# Patient Record
Sex: Female | Born: 1975 | Race: Black or African American | Hispanic: No | Marital: Single
Health system: Midwestern US, Academic
[De-identification: ages and names within clinical notes are randomized; demographics above are authoritative.]

---

## 2010-02-22 ENCOUNTER — Inpatient Hospital Stay

## 2010-02-22 LAB — OFFICE VISIT LAB RESULTS

## 2010-02-22 NOTE — Unmapped (Signed)
Signed by Idelle Leech RN on 02/22/2010 at 08:52:35    Printed Handout:  - Danger Signs and Symptoms of Pregnancy

## 2010-02-22 NOTE — Unmapped (Signed)
Signed by Idelle Leech RN on 02/22/2010 at 09:01:35    Printed Handout:  - IP Policy

## 2010-02-22 NOTE — Unmapped (Signed)
Signed by Idelle Leech RN on 02/22/2010 at 09:01:35    Printed Handout:  - Food Sources of Calcium

## 2010-02-22 NOTE — Unmapped (Signed)
Signed by Aretta Nip NP on 02/22/2010 at 00:00:00  To Receive Records FR: [facility] (REQUEST ONLY)      Imported By: Georgianne Fick 02/22/2010 14:33:55    _____________________________________________________________________    External Attachment:    Please see [facility] EMR for this document.

## 2010-02-22 NOTE — Unmapped (Signed)
Signed by Natasha  Mixon RN on 02/22/2010 at 09:01:35    Printed Handout:  - IP Policy

## 2010-02-22 NOTE — Unmapped (Signed)
Signed by Idelle Leech RN on 02/22/2010 at 09:02:05    Printed Handout:  - Medical Records Release

## 2010-02-22 NOTE — Unmapped (Signed)
Signed by Idelle Leech RN on 02/22/2010 at 12:40:08    PRENATAL VISIT      Reason for Visit   Chief Complaint: New OB Intake Visit  History from: patient    Allergies  ! * PCN    Medications  PRENATAL VITAMINS  TABS (PRENATAL MV & MIN W/FE-FA TABS) by mouth daily  CEPHALEXIN 500 MG CAPS (CEPHALEXIN) by mouth four times a day x 10 days        Vital Signs   Height: 67 in.   Method of Determining Height: per patient  Weight: 151.5 lbs.     BMI (in-lb): 23.81   Pulse rate: 61   Temperature: 99.0 degrees  F   Blood Pressure   BP #1: 99 / 65mm Hg     Pain:     Have you had pain other than everyday aches and pains (e.g., mild headache, back ache, strains) in the past week?   No    Intake recorded by: Idelle Leech RN on February 22, 2010 8:40 AM     Communication Sensory   Primary Language English  Barriers to Care:  Visual.  lazy left eye    Other   Functional Screen/Fall Risk Assessment   In the past two months, patient has experienced:  1.  A decreased ability to walk, turn in bed, get in/out of a chair? No  2.  Decreased ability to care for self, perform routine tasks? No  3.  Recent problem with coordination/movement or loss of balance? No  4.  Use of ambulation device such as walker/cane or crutches? No  5.  Weakness, dizziness, shortness of breath, fatigue with activity? No  6.  Recent frequent history of falling? No  7.  Do you exercise? Yes  What? squats, sit ups, walking  How often? has not been exercising lately     Nutritional Screen   1.  Do you eat a special diet or meal plan? No2.  Have you had a recent weight gain or loss of 10 lbs (in the past 2 months?) No  3.  Do you have a difficulty eating, chewing, swallowing or speaking? No  4.  When was the last time you were seen by the Dentist?  2009     Emotional Psychosocial Spiritual   1.  Do you have any spiritual, religious or cultural rituals that we need to be aware of? No  2.  Patient feels:  Trouble Sleeping  3.  Do you have current, recent thoughts that  you would be better off dead or of hurting yourself? No  4.  Do you have a mental health provider, case manager or payee? No  5.  Patient currently receives:  None.  6.  Do you have adequate resources/medications? Yes  7.  Recent loss:No problem.  8.  Household: Other-with friend     Abuse/Neglect   Due to the increase in domestic violence, we ask all patients:  1.  Have you recently been threatened, frightened, mistreated, hurt or hit by anyone in your life? No  2.  Have you had money or other items taken from you without your permission? No     Wellness   Have you had the following immunizations/vaccinations? (check all that apply)    Tetanus or TDAP (within last 10 yrs?)   Educational Knowledge   Understanding of current health problems: Knowledgeable  Learning Preference: Listening  Questionnaire completed: 02/22/2010    Signed by:  Idelle Leech RN on  February 22, 2010 8:38 AM        Menses History:   [facility] Delivery[facility]  First day LMP: 11/21/2009  This date is: definite  Menses monthly: yes  Frequency:  28 days  Menarche: 16  LMP duration/flow: typical  Contraception used at conception: None  Planned pregnancy: no  Pregnancy confirmed by: urine HCG  Date confirmed: 12/2009  G: 2  P 0:     # Living Children: 0  SAB: 1History of Multifetal Delivery: No    Past History  Past Medical History:  No significant past medical history.  Family History: Mother - cancer-deceased  MGM - cancer-deceased  Social History: Preferred Language: English,   Marital Status: divorced,   Children: None,   Employment Status: unemployed,   Patient Lives at: apartment,   Support System: adequate  Exercise: none,   Caffeine per Day: 0  Seatbelt Use: 100 % of the time  Alcohol Use: none  Drug Use: none  Tobacco Usage:non-smoker      Other Pertinent History:   Abuse: No  Pre-Pregnacy Tobacco Use per Day: 0  Pregnancy Tobacco Use per Day: 0  Pre-Pregnacy Alcohol Use per Day: 0  Pregnancy Alcohol Use per Day:  0  Pre-Pregnancy Illicit/Recreational Drug Use per Day: 0  Pregnancy Illicit/Recreational Drug Use per Day: 0  D(Rh) Sensitized: No  Latex Allergy: No  Betadine Allergy: No  Anesthetic Complications No  History of Abnormal Pap: Yes- colposcopy done in past  Uterine Anomaly: No  History of Infertility: No  DES Exposure: No  ART Treatment: No      Infection History:   Exposed to TB: no   History of Genital Herpes: no   Sexual partner w/hx of oral or genital HSV: no  Rash or Viral Illness since LMP: no  Hepatitis B: no  Vaccinated for Hepatits B: yes  Hepatitis C: no- unsure  History of Varicella Infection: yes  History of STD: yes- chlamydia treated in past  History of PID: no  Do you care for any cats: no    Genetic Testing/Teratology Counseling:   Patient's Age>= 35 Years as of Estimated Date of Delivery: no  Thalassemia Risk: no  Neural Tube Defect: no  Congenital Heart Defect: no   Down Syndrome: no  Tay-Sachs: no  Canavan Disease: no  Familial Dysautonomia: no  Sickle Cell Disease/Trait: no  Hemophilia/Blood Disorders: no  Muscular Dystrophy: no  Cystic Fibrosis: no  Huntington's Chorea: no  Mental Retardation/Autism no  Tested for Fragile X: no  Other Genetic or Chromosomal: no  Maternal Metabolic Disorder: no  Other birth defects: no  Recurrent Pregnancy Loss/Stillbirth: no  Medications/Drugs/Alcohol since LMP: no- Agents and Strength/Dose: antibiotic for UTI  Previous Genetic testing for Cystic Fibrosis: no  Previous Genetic testing father of baby: no    Past Pregnancies:  G:  2 P:  0 A:    SAB: 1  EAB:    Term Deliveries:    Premature Deliveries:   Ectopic pregnancies:    H/O Multi-fetal Delivery:  No    Preg # Date Early Loss/ Termination GA Wks Lgth of Labor Brth Wt Sex Type of Del Anes Pl of Del PTL Fetal Comp Matrnl Comp Cmnts   1 1996 SAB 6             2 current               3  4                      EDD:   Pregnancy Dating Information:    LMP: 11/21/2009    EDD by LMP: 08/28/2010  EDD:  08/28/2010  Today's EGA in weeks: 13 2/7    Prenatal Exam:     Weight      Exam   Est. Gestational Age: 74 2/7          Assessment   34 Years Old Female      G: 2  P 0:     # Living Children: 0  SAB: 1  GA: 13 2/7 weeks       EDD: 08/28/2010    New Problems:   AMENORRHEA (ICD-626.0)  PREGNANCY, MULTIGRAVIDA (ICD-V22.1)      Disposition:   Clinic: NP  Primary OB Provider: Tiandra Swoveland  Return to clinic for Provider Visit Appointment Reason: NOB    *Patient Care Summary printed and given to patient.      Patient Education   Education was provided to: patient  Patient Response: Expressed understanding  1st Trimester Education:  HIV/Prenatal tests, prenatal history risk factors, course of prenatal care, exercise/nutrition/wt gain, toxoplasmosis (cat/raw meat), sexual activity, exercise, influenza vaccine, smoking counseling, environment/work hazards, travel/seatbelts, tobacco/alcohol/drugs, use of medications, indications for ultrasound, domestic violence, childbirth classes, hospital facilities        [age] old female G2  , P0  , hx of one SAB currently at 39 2/[redacted] weeks gestation here to start prenatal care.  Prenatal teaching begun and danger and labor signs in pregnancy reviewed.  L and D triage and [facility] phone numbers given.  Pregnancy statement given and patient reffered to [facility]. Pt stated she [place] prenatal care [place] of [place], will send for prenatal records. Centering discussed and offered and pt excepted, pt given list of dates for [facility], pt to [facility] March 01, 2010 for NOB visit...................................................................Marland KitchenMarcelle Smiling Mixon RN  February 22, 2010 9:15 AM                  ]

## 2010-02-22 NOTE — Unmapped (Signed)
Signed by Idelle Leech RN on 02/22/2010 at 09:01:35    Printed Handout:  - Welcome to [facility]!

## 2010-02-22 NOTE — Unmapped (Signed)
Signed by   LinkLog[employer] on 02/22/2010 at 08:40:46  Patient: Veronica Rocha  Note: All result statuses are Final unless otherwise noted.    Tests: (1) POC HCG QUALITATIVE, URINE (POCPREG)  ! hCG Qualitative      [A]  Positive                    Negative    Note: An exclamation mark (!) indicates a result that was not dispersed into   the flowsheet.  Document Creation Date: 02/22/2010 8:40 AM  _______________________________________________________________________    (1) Order result status: Final  Collection or observation date-time: 02/22/2010 08:29  Requested date-time: 02/22/2010 08:29  Receipt date-time: 02/22/2010 07:58  Reported date-time: 02/22/2010 08:40  Referring Physician: Nelida Gores REFERRAL PT  Ordering Physician: Smitty Pluck PT [phone])  Specimen Source: U&URINE     UACUP&URINALYSIS CONTAINER  Source: Butler Denmark Order Number: [id number]  Lab site: [facility]      [address]      [place] [address]  [phone]

## 2010-02-22 NOTE — Unmapped (Signed)
Signed by Natasha  Mixon RN on 02/22/2010 at 09:01:35    Printed Handout:  - Welcome to [facility]!

## 2010-02-23 NOTE — Unmapped (Signed)
Signed by Aretta Nip NP on 02/27/2010 at 14:51:46  To Receive Records FR: [facility] (RECORDS & REQUEST ENCLOSED)      Imported By: Georgianne Fick 02/23/2010 15:37:38    _____________________________________________________________________    External Attachment:    Please see [facility] EMR for this document.

## 2010-02-28 ENCOUNTER — Inpatient Hospital Stay

## 2010-03-01 LAB — OFFICE VISIT LAB RESULTS
Chlamydia Trachomatis DNA Swab: NEGATIVE
HGB A1: 97 %
Hematocrit: 37 %
Hemoglobin: 12.4 g/dL
Hemoglobinopathy Profile: NORMAL
Hep B Surface Ag: NEGATIVE
Hgb A2 Quant: 2.5 %
Hgb C: 0 %
Hgb F Quant: 0.5 %
Hgb S: 0 %
MCV: 87 fL
N. Gonorrhoeae DNA Probe Result:: NEGATIVE
Rubella Antibodies, IgG: DETECTED

## 2010-03-01 LAB — ABO/RH: Rh Type: POSITIVE

## 2010-03-01 LAB — CYTOLOGY HUMAN PAPILLOMA VIRUS DNA PROBE

## 2010-03-01 LAB — ANTIBODY SCREEN: Antibody Screen: NEGATIVE

## 2010-03-01 NOTE — Unmapped (Signed)
Signed by Aretta Nip NP on 03/06/2010 at 14:40:10  Patient: Veronica Rocha  Note: All result statuses are Final unless otherwise noted.    Tests: (1) URINE CULTURE ([facility])  ! Clinical Report           FINAL Report      Specimen: MIDSTREAM URINE  Collected: 03/01/2010 11:58     Status: Final      Last Updated: 03/02/2010 21:45                Culture Result: (Final)      >100,000 cfu/mL      Lactobacillus species No Further Workup          Note: An exclamation mark (!) indicates a result that was not dispersed into   the flowsheet.  Document Creation Date: 03/02/2010 9:45 PM  _______________________________________________________________________    (1) Order result status: Final  Collection or observation date-time: 03/01/2010 11:58  Requested date-time: 03/01/2010 11:58  Receipt date-time: 03/01/2010 20:14  Reported date-time: 03/02/2010 21:45  Referring Physician: Royetta Asal  Ordering Physician: Alvino Chapel ANN Thornton Dohrmann (VIDETTJA)  Specimen Source: UM&MIDSTREAM URINE     UGRS&Boric Acid, Small Urine Tube  Source: Faith Rogue Order Number: [id number]  Lab site: [facility]      [address]      [place] [address]  [phone]

## 2010-03-01 NOTE — Unmapped (Signed)
Signed by Aretta Nip NP on 03/01/2010 at 12:29:06  Patient: Veronica Rocha  Note: All result statuses are Final unless otherwise noted.    Tests: (1) ABO GROUP & RH TYPE ([facility])    ABO Group                 B    Rh Type                   Positive    Note: An exclamation mark (!) indicates a result that was not dispersed into   the flowsheet.  Document Creation Date: 03/01/2010 11:02 AM  _______________________________________________________________________    (1) Order result status: Final  Collection or observation date-time: 03/01/2010 09:50  Requested date-time: 03/01/2010 09:50  Receipt date-time: 03/01/2010 10:15  Reported date-time: 03/01/2010 11:02  Referring Physician: Royetta Asal  Ordering Physician: Alvino Chapel ANN Birdie Beveridge (VIDETTJA)  Specimen Source: B&BLOOD     PINK6&PINK (6ml Plastic EDTA 13x180mm)  Source: Faith Rogue Order Number: [id number]  Lab site: [facility]      [address]      [place] [address]  [phone]

## 2010-03-01 NOTE — Unmapped (Signed)
Signed by Aretta Nip NP on 03/01/2010 at 12:29:06  Patient: Kara Dies Cressy  Note: All result statuses are Final unless otherwise noted.    Tests: (1) ANTIBODY SCREEN (ABS)    Antibody Screen           Negative    Note: An exclamation mark (!) indicates a result that was not dispersed into   the flowsheet.  Document Creation Date: 03/01/2010 11:17 AM  _______________________________________________________________________    (1) Order result status: Final  Collection or observation date-time: 03/01/2010 09:50  Requested date-time: 03/01/2010 09:50  Receipt date-time: 03/01/2010 10:15  Reported date-time: 03/01/2010 11:18  Referring Physician: Royetta Asal  Ordering Physician: Alvino Chapel ANN Clive Parcel (VIDETTJA)  Specimen Source: B&BLOOD     PINK6&PINK (6ml Plastic EDTA 13x167mm)  Source: Faith Rogue Order Number: [id number]  Lab site: [facility]      [address]      [place] [address]  [phone]

## 2010-03-01 NOTE — Unmapped (Signed)
Signed by Idelle Leech RN on 03/07/2010 at 13:57:15      Follow-up for Test Results:   Test results are: abnormal  Comments: Pap is ASCUS with HPV high risk positive.  Please schedule colposcopy and inform the patient.  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

## 2010-03-01 NOTE — Unmapped (Signed)
Signed by Aretta Nip NP on 03/01/2010 at 13:32:12    4PRENATAL VISIT      Reason for Visit   Chief Complaint: New OB Provider Visit  History from: patient    Allergies  ! * PCN    Medications  PRENATAL VITAMINS  TABS (PRENATAL MV & MIN W/FE-FA TABS) by mouth daily  CEPHALEXIN 500 MG CAPS (CEPHALEXIN) by mouth four times a day x 10 days        Vital Signs   Height: 67 in.   Weight: 153 lbs.   Pre-Pregnancy Weight: 154 lbs.    Wt change since last visit: 1.50 lbs      Total Preg Wt Change: 153 lbs   BMI (in-lb): 24.05   BSA (m2): 1.81  Pulse rate: 76   Temperature: 99 degrees  F   Blood Pressure   BP #1: 102 / 60mm Hg  Cuff Size: Std     Pain:     Pain Scale: 0    Intake recorded by: Rennis Chris on March 01, 2010 8:52 AM     Communication Sensory   Primary Language English     Functional Screen/Fall Risk Assessment   In the past two months, patient has experienced:  1.  A decreased ability to walk, turn in bed, get in/out of a chair? No  2.  Decreased ability to care for self, perform routine tasks? No  3.  Recent problem with coordination/movement or loss of balance? No  4.  Use of ambulation device such as walker/cane or crutches? No  5.  Weakness, dizziness, shortness of breath, fatigue with activity? No  6.  Recent frequent history of falling? No  7.  Do you exercise? Yes  What? running-cardio and strenght  How often? every day     Nutritional Screen   1.  Do you eat a special diet or meal plan? No2.  Have you had a recent weight gain or loss of 10 lbs (in the past 2 months?) No  3.  Do you have a difficulty eating, chewing, swallowing or speaking? No     Emotional Psychosocial Spiritual   1.  Do you have any spiritual, religious or cultural rituals that we need to be aware of? No  2.  Patient feels:  Sad, Trouble Sleeping  3.  Do you have current, recent thoughts that you would be better off dead or of hurting yourself? No  4.  Do you have a mental health provider, case manager or payee? No  5.  Patient  currently receives:  None.  6.  Do you have adequate resources/medications? Yes  7.  Recent loss:Job, home/shelter.  8.  Household: With family      Abuse/Neglect   Due to the increase in domestic violence, we ask all patients:  1.  Have you recently been threatened, frightened, mistreated, hurt or hit by anyone in your life? No  2.  Have you had money or other items taken from you without your permission? No     Wellness   Have you had the following immunizations/vaccinations? (check all that apply)    Hepatitis B Series mo/yr: 01/2009     Educational Knowledge   Understanding of current health problems: Knowledgeable  Learning Preference: Listening, Reading, Doing  What is your highest level of school completed? 12  Questionnaire completed: 03/01/2010    Signed by:  Rennis Chris on March 01, 2010 9:02 AM    Medications reviewed, updated and verified with  patient or patient representative.      Menses History:   [facility]  First day LMP: 11/21/2009  This date is: approximate  Menses monthly: yes  Frequency:  28 days  Menarche: 16  Contraception used at conception: None  Planned pregnancy: no  Pregnancy confirmed by: urine HCG  Date confirmed: 12/2009  G: 2  P 0:     # Living Children: 0  SAB: 1History of Multifetal Delivery: No      Other Pertinent History:   Pre-Pregnacy Tobacco Use per Day: 0  Pregnancy Tobacco Use per Day: 0  Pre-Pregnacy Alcohol Use per Day: 0  Pregnancy Alcohol Use per Day: 0  Pre-Pregnancy Illicit/Recreational Drug Use per Day: 0  Pregnancy Illicit/Recreational Drug Use per Day: 0  D(Rh) Sensitized: No  Latex Allergy: No  Betadine Allergy: No  Anesthetic Complications No  History of Abnormal Pap: Yes- colposcopy done in past  Uterine Anomaly: No  History of Infertility: No  DES Exposure: No  ART Treatment: No      Infection History:   Exposed to TB: no   History of Genital Herpes: no   Sexual partner w/hx of oral or genital HSV: no  Rash or Viral Illness since  LMP: no  Hepatitis B: no  Vaccinated for Hepatits B: yes  Hepatitis C: no- unsure  History of Varicella Infection: yes  History of STD: yes- chlamydia treated in past  History of PID: no  Do you care for any cats: no    Genetic Testing/Teratology Counseling:   Patient's Age>= 35 Years as of Estimated Date of Delivery: no  Thalassemia Risk: no  Neural Tube Defect: no  Congenital Heart Defect: no   Down Syndrome: no  Tay-Sachs: no  Canavan Disease: no  Familial Dysautonomia: no  Sickle Cell Disease/Trait: no  Hemophilia/Blood Disorders: no  Muscular Dystrophy: no  Cystic Fibrosis: no  Huntington's Chorea: no  Mental Retardation/Autism no  Tested for Fragile X: no  Other Genetic or Chromosomal: no  Maternal Metabolic Disorder: no  Other birth defects: no  Recurrent Pregnancy Loss/Stillbirth: no  Medications/Drugs/Alcohol since LMP: no- Agents and Strength/Dose: antibiotic for UTI  Previous Genetic testing for Cystic Fibrosis: no  Previous Genetic testing father of baby: no    EDD:   LMP: 11/21/2009    EDD by LMP: 08/28/2010  EDD: 08/28/2010  Today's EGA in weeks: 14 2/7    Symptoms:   Denies: back pain, change in discharge, constipation, fatigue, headache, heartburn, nausea, vomiting, pelvic pressure, preterm labor symptoms, swelling, uterine activity, vaginal bleeding, loss of fluid, concerns for personal safety  Fetal movement: not yet noted  Pain Scale: 0    Prenatal Exam:     Weight    Pre-Pregnancy Weight: 154 lbs.  Total weight change: -1 lbs.  Weight change since last visit: 1.50 lbs.    Exam   Est. Gestational Age: 54 2/7  Fundal height (cm): 3 fingers below umbilicus  FHR: 140's  Cervical dilation: cl  Cervical effacement: thick  Station: high  Edema: none    List of Diagnoses/Issues:   -transfer of care from [place]    PHYSICAL EXAMINATION  Constitutional: alert, no acute distress, well hydrated, well developed, well nourished, appropriate for age;    Skin: normal color, no rashes, no lesions, warm to  touch;    Head: atraumatic, normocephalic;    Eyes: pupils equal, no injection, no icterus;    Ears/Nose/Throat: external ears normal, hearing normal, external nose  normal;    Neck: supple, no adenopathy, no masses, no thyromegaly;    Chest: no apparent respiratory distress, normal chest inspection, clear to auscultation, normal breath sounds bilaterally;    Breast:  breast symmetrical, no tenderness, no dimpling, no masses, no erythema, no axillary adenopathy, normal aerola bilaterally, no nipple discharge, no inversion;    Cardiac:  normal S1 and S2, regular rate and rhythm, no murmurs, no gallops, no rubs;    Abdomen: nondistended, nontender, no guarding, no masses, no organomegaly, no abdominal hernias, no suprapubic tenderness;    External Genitalia:  no lesions, normal estrogen effect, normal hair pattern, no clitorimegaly, no erythema, normal perineal body;    Urethra:  normal appearance, no erythema, no tenderness, normal urethral meatus;    Vagina:  no discharge, no lesions, no masses, no cystocele, no rectocele, normal support, normal rugations;    Cervix:  present, no lesions, no discharge, no cervical motion tenderness;    Uterus:  uterus midline, non-tender, regular contour, mobile;  gravida 14wks.    Adnexa:  no masses, no tenderness;    Lymphatic: no cervical adenopathy, no supraclavicular adenopathy, no axillary adenopathy, no inguinal adenopathy, no femoral adenopathy, no enlargement;    Back: no deformities, normal spine, no cva tenderness;    Extremities: no edema, no lesions;    Neuro: normal mental status;    Psych: oriented to time; place; and person, affect and mood appropriate;          Assessment   34 Years Old Female      G: 2  P 0:     # Living Children: 0  SAB: 1  GA: 14 2/7 weeks       EDD: 08/28/2010    Status of Existing Problems  Assessed PREGNANCY, MULTIGRAVIDA as comment only - 14 2/7wks NOB   transfer from [place]-records in EMR  type, Rh, and antibody screen today  centering  discussed and pt interested  early warnings discussed  OB US for dating ordered  Rx prenatal vitamins  .RTPO 04/05/2010 for Centering - Aretta Nip NP - Signed    Today's Orders   ABO / RH  [facility]) [phone])  [*[id number]]  Antibody Screen     (ABS) (795)  [*[id number]]  1st Trimester [place], Dating and/or Viability [[id number]]    Disposition:   Clinic: Centering  Primary OB Provider: Laurette Villescas  Appointment Reason: Centering 04/05/2010    *Patient Care Summary printed and given to patient.      Patient Education   1st Trimester Education:  HIV/Prenatal tests, prenatal history risk factors, course of prenatal care, exercise/nutrition/wt gain, toxoplasmosis (cat/raw meat), sexual activity, exercise, influenza vaccine, smoking counseling, environment/work hazards, travel/seatbelts, tobacco/alcohol/drugs, use of medications, indications for ultrasound, domestic violence, childbirth classes, hospital facilities                ]    OB Initial Labs:   Hemoglobin:   12.4  Hematocrit:   37  MCV: 87  Pap Smear: ASCUS with HPV high risk positive  Rubella: antibody detected  RPR: NR  HBsAg: Negative  HIV: NR  Hgb Electrophoresis Phenotype: Normal  Hemoglobin A1: 97.0  Hemoglobin F: 0.5  Hemoglobin A2: 2.5  Hemoglobin C: 0.0  Hemoglobin S: 0.0  Chlamydia: Negative  Gonorrhea: Negative    Recorded by: Aretta Nip NP on March 01, 2010 12:45 PM      PATIENT EDUCATION  1st Trimester Education:    HIV/Prenatal tests, prenatal history risk factors, course of prenatal  care, exercise/nutrition/wt gain, toxoplasmosis (cat/raw meat), sexual activity, exercise, influenza vaccine, smoking counseling, environment/work hazards, travel/seatbelts, tobacco/alcohol/drugs, use of medications, indications for ultrasound, domestic violence, childbirth classes, hospital facilities    NURSE EXIT NOTES   Pt here for NOB provider visit.  Sono for dates scheduled for 03/08/10 at 1:40 pm.  Order faxed.  To lab for blood draw for remainder of prenatal labs.  To  start [facility] 11.  Given list of all appts for Centering  ...................................................................[facility] RN  March 01, 2010 12:54 PM

## 2010-03-02 NOTE — Unmapped (Signed)
Signed by Arvil Chaco CNP on 03/02/2010 at 16:13:22    PRENATAL VISIT        Allergies  ! * PCN    Vital Signs   Height: 67 in.   Pre-Pregnancy Weight: 154 lbs.                   Menses History:   Hospital of Delivery: [facility]  First day LMP: 11/21/2009  This date is: approximate  Menses monthly: yes  Frequency:  28 days  Menarche: 16  Contraception used at conception: None  Planned pregnancy: no  Pregnancy confirmed by: urine HCG  Date confirmed: 12/2009  G: 2  P 0:     # Living Children: 0  SAB: 1History of Multifetal Delivery: No      Other Pertinent History:   Pre-Pregnacy Tobacco Use per Day: 0  Pregnancy Tobacco Use per Day: 0  Pre-Pregnacy Alcohol Use per Day: 0  Pregnancy Alcohol Use per Day: 0  Pre-Pregnancy Illicit/Recreational Drug Use per Day: 0  Pregnancy Illicit/Recreational Drug Use per Day: 0  D(Rh) Sensitized: No  Latex Allergy: No  Betadine Allergy: No  Anesthetic Complications No  History of Abnormal Pap: Yes- colposcopy done in past  Uterine Anomaly: No  History of Infertility: No  DES Exposure: No  ART Treatment: No      Infection History:   Exposed to TB: no   History of Genital Herpes: no   Sexual partner w/hx of oral or genital HSV: no  Rash or Viral Illness since LMP: no  Hepatitis B: no  Vaccinated for Hepatits B: yes  Hepatitis C: no- unsure  History of Varicella Infection: yes  History of STD: yes- chlamydia treated in past  History of PID: no  Do you care for any cats: no    Genetic Testing/Teratology Counseling:   Patient's Age>= 35 Years as of Estimated Date of Delivery: no  Thalassemia Risk: no  Neural Tube Defect: no  Congenital Heart Defect: no   Down Syndrome: no  Tay-Sachs: no  Canavan Disease: no  Familial Dysautonomia: no  Sickle Cell Disease/Trait: no  Hemophilia/Blood Disorders: no  Muscular Dystrophy: no  Cystic Fibrosis: no  Huntington's Chorea: no  Mental Retardation/Autism no  Tested for Fragile X: no  Other Genetic or Chromosomal: no  Maternal Metabolic  Disorder: no  Other birth defects: no  Recurrent Pregnancy Loss/Stillbirth: no  Medications/Drugs/Alcohol since LMP: no- Agents and Strength/Dose: antibiotic for UTI  Previous Genetic testing for Cystic Fibrosis: no  Previous Genetic testing father of baby: no    EDD:   LMP: 11/21/2009    EDD by LMP: 08/28/2010  EDD: 08/28/2010  Prenatal Exam:     Weight    Pre-Pregnancy Weight: 154 lbs.    List of Diagnoses/Issues:   -transfer of care from [facility]      Initial Labs:   Hemoglobin A1:  97.0 (01/10/2010 12:45:01 PM)  Hemoglobin F:  0.5 (01/10/2010 12:45:01 PM)  Hemoglobin A2:  2.5 (01/10/2010 12:45:01 PM)  Hemoglobin C:  0.0 (01/10/2010 12:45:01 PM)  Hemoglobin S:  0.0 (01/10/2010 12:45:01 PM)        Assessment   34 Years Old Female      G: 2  P 0:     # Living Children: 0  SAB: 1       EDD: 08/28/2010    Disposition:   Clinic: Centering  Primary OB Provider: Videtto      Patient Education  1st Trimester Education:  HIV/Prenatal tests, prenatal history risk factors, course of prenatal care, exercise/nutrition/wt gain, toxoplasmosis (cat/raw meat), sexual activity, exercise, influenza vaccine, smoking counseling, environment/work hazards, travel/seatbelts, tobacco/alcohol/drugs, use of medications, indications for ultrasound, domestic violence, childbirth classes, hospital facilities                ]

## 2010-03-06 LAB — OFFICE VISIT LAB RESULTS: Urine Culture, Comprehensive: POSITIVE

## 2010-03-06 NOTE — Unmapped (Signed)
Signed by Aretta Nip NP on 03/06/2010 at 14:40:10      Initial Labs:   Urine Culture: positive  Hemoglobin A1:  97.0 (01/10/2010 12:45:01 PM)  Hemoglobin F:  0.5 (01/10/2010 12:45:01 PM)  Hemoglobin A2:  2.5 (01/10/2010 12:45:01 PM)  Hemoglobin C:  0.0 (01/10/2010 12:45:01 PM)  Hemoglobin S:  0.0 (01/10/2010 12:45:01 PM)        OB Initial Labs:   Urine Culture positive  Organism 1: Lactobacillus  Colony Count: >100,000    Recorded by: Aretta Nip NP on Mar 06, 2010 2:39 PM

## 2010-03-08 ENCOUNTER — Inpatient Hospital Stay: Attending: Maternal & Fetal Medicine

## 2010-03-08 NOTE — Unmapped (Signed)
Signed by Aretta Nip NP on 03/09/2010 at 07:38:28  OB US      Imported By: Georgianne Fick 03/08/2010 14:33:21    _____________________________________________________________________    External Attachment:    Please see [facility] EMR for this document.

## 2010-03-09 NOTE — Unmapped (Signed)
Signed by Aretta Nip NP on 03/09/2010 at 07:38:28      Initial Labs:   Urine Culture: positive  Hemoglobin A1:  97.0 (01/10/2010 12:45:01 PM)  Hemoglobin F:  0.5 (01/10/2010 12:45:01 PM)  Hemoglobin A2:  2.5 (01/10/2010 12:45:01 PM)  Hemoglobin C:  0.0 (01/10/2010 12:45:01 PM)  Hemoglobin S:  0.0 (01/10/2010 12:45:01 PM)            EDD:   LMP: 11/21/2009    EDD by LMP: 08/28/2010  1st [place] date: 03/08/2010 = 15 weeks, 2 days  EDD by 1st [place]: 08/28/2010  EDD: 08/28/2010  Dating Comments:  EDD by LMP.  ...................................................................Aretta Nip NP  Mar 09, 2010 7:38 AM     Prenatal Exam:     Weight    Pre-Pregnancy Weight: 154 lbs.    List of Diagnoses/Issues:   -transfer of care from [facility]

## 2010-03-10 NOTE — Unmapped (Signed)
Signed by Audie Box RN on 03/13/2010 at 13:34:48    PHONE NOTE          OBGYN - Telephone Triage:       Other:   Pt. called to speak to NP re: contact information for previous provider.  Pt. states signed ROI  to obtain records but did not have numbers to obtain.  Previous provider is [facility] and the fax number is [phone] and the office number is [phone].  If more information is needed, pt.'s phone number is [phone]...................................................................Marland KitchenCorinne Ports RN  Mar 10, 2010 10:54 AM

## 2010-03-14 LAB — OFFICE VISIT LAB RESULTS
Antibody Screen: NEGATIVE
Chlamydia Trachomatis DNA Swab: NEGATIVE
Hematocrit: 37 %
Hemoglobin: 12.4 g/dL
Hemoglobinopathy Profile: NORMAL
N. Gonorrhoeae DNA Probe Result:: NEGATIVE
Rh Type: POSITIVE
Rubella Antibodies, IgG: IMMUNE
Urine Culture, Comprehensive: NEGATIVE

## 2010-03-14 NOTE — Unmapped (Signed)
Signed by Willeen Niece RN on 03/14/2010 at 16:21:01      OB Initial Labs:   Blood Type: B  D (Rh) Type: positive  Antibody Screen: negative  Hemoglobin:   12.4  Hematocrit:   37  Pap Smear: 01/13/2010  ASCUS /c positive HPV  Rubella: Immune  RPR: NR  HBsAg: NR  HIV: NR  Hgb Electrophoresis Phenotype: Normal  Chlamydia: Negative  Gonorrhea: negative  Urine Culture negative    Recorded by: Willeen Niece RN on Mar 14, 2010 4:19 PM          Clinical Issues:   Complete Problem List:   ASCUS PAPHPV HIGH RISK POSITIVE (ICD-795.01)  AMENORRHEA (ICD-626.0)  PREGNANCY, MULTIGRAVIDA (ICD-V22.1)      Current Medications:   PRENATAL VITAMINS  TABS (PRENATAL MV & MIN W/FE-FA TABS) by mouth daily  CEPHALEXIN 500 MG CAPS (CEPHALEXIN) by mouth four times a day x 10 days      List of Diagnoses/Issues:   -transfer of care from [place]      OB Ultrasound   1st Ultrasound Date:  03/08/2010  GA:  15 2/7wks  Placenta:  fundal  Recommendations:  Comprehensive fetal survey at [employer].  [place] Comments:  IUP at 22 2/7wks with an EDD of 08/28/2010.      EDD:   LMP: 11/21/2009    EDD by LMP: 08/28/2010  1st [place] date: 03/08/2010 = 15  weeks, 2  days  EDD by 1st [place]: 08/28/2010  EDD: 08/28/2010  Dating Comments:  EDD by LMP.  ...................................................................Aretta Nip NP  Mar 09, 2010 7:38 AM       Preventive Maintenance

## 2010-03-30 LAB — OFFICE VISIT LAB RESULTS
Antibody Screen: NEGATIVE
Chlamydia Trachomatis DNA, SDA, Pap Vial: NEGATIVE
Hematocrit: 37
Hemoglobin: 12.4
MCV: 87
Rubella Antibodies: IMMUNE

## 2010-03-30 LAB — MATERNAL SCREEN 4
AFP MOM: 1.78 {M.o.M}
AFP: 87.4
Estriol Mom: 1.1 {M.o.M}
Estriol: 1.47 ng/mL
HCG Mom: 0.48 {M.o.M}
hCG Quant: 9882 milliintl units/mL

## 2010-03-30 LAB — POC URINALYSIS
Bilirubin Urine: NEGATIVE
Glucose, UA: NEGATIVE g/dL
Ketones, UA: NEGATIVE
Leukocytes, UA: NEGATIVE
Nitrite, UA: NEGATIVE
Protein, UA: NEGATIVE
RBC, UA: NEGATIVE
Specific Gravity, UA: 1.015 (ref 1.005–1.035)
Urobilinogen, UA: 0.2 E units/dL (ref 0.2–1.0)
pH, UA: 7 (ref 5.0–8.0)

## 2010-03-30 NOTE — Unmapped (Signed)
Signed by Arvil Chaco CNP on 03/31/2010 at 08:04:17    PRENATAL VISIT      Reason for Visit   Chief Complaint: Return OB Visit  History from: patient    Allergies  ! * PCN    Vital Signs   Height: 67 in.   Method of Determining Height: 159.5  Pre-Pregnancy Weight: 154 lbs.  Pulse rate: 82     Blood Pressure   BP #1: 111 / 58mm Hg     Pain:     Pain Scale: 1 out of 10    Intake recorded by: Willeen Niece RN on Mar 30, 2010 1:53 PM                   Menses History:   [facility] Delivery[facility]  First day LMP: 11/21/2009  This date is: approximate  Menses monthly: yes  Frequency:  28 days  Menarche: 16  Contraception used at conception: None  Planned pregnancy: no  Pregnancy confirmed by: urine HCG  Date confirmed: 12/2009  G: 2  P 0:     # Living Children: 0  SAB: 1History of Multifetal Delivery: No      Other Pertinent History:   Pre-Pregnacy Tobacco Use per Day: 0  Pregnancy Tobacco Use per Day: 0  Pre-Pregnacy Alcohol Use per Day: 0  Pregnancy Alcohol Use per Day: 0  Pre-Pregnancy Illicit/Recreational Drug Use per Day: 0  Pregnancy Illicit/Recreational Drug Use per Day: 0  D(Rh) Sensitized: No  Latex Allergy: No  Betadine Allergy: No  Anesthetic Complications No  History of Abnormal Pap: Yes- colposcopy done in past  Uterine Anomaly: No  History of Infertility: No  DES Exposure: No  ART Treatment: No      Infection History:   Exposed to TB: no   History of Genital Herpes: no   Sexual partner w/hx of oral or genital HSV: no  Rash or Viral Illness since LMP: no  Hepatitis B: no  Vaccinated for Hepatits B: yes  Hepatitis C: no- unsure  History of Varicella Infection: yes  History of STD: yes- chlamydia treated in past  History of PID: no  Do you care for any cats: no    Genetic Testing/Teratology Counseling:   Patient's Age>= 35 Years as of Estimated Date of Delivery: no  Thalassemia Risk: no  Neural Tube Defect: no  Congenital Heart Defect: no   Down Syndrome: no  Tay-Sachs: no  Canavan Disease:  no  Familial Dysautonomia: no  Sickle Cell Disease/Trait: no  Hemophilia/Blood Disorders: no  Muscular Dystrophy: no  Cystic Fibrosis: no  Huntington's Chorea: no  Mental Retardation/Autism no  Tested for Fragile X: no  Other Genetic or Chromosomal: no  Maternal Metabolic Disorder: no  Other birth defects: no  Recurrent Pregnancy Loss/Stillbirth: no  Medications/Drugs/Alcohol since LMP: no- Agents and Strength/Dose: antibiotic for UTI  Previous Genetic testing for Cystic Fibrosis: no  Previous Genetic testing father of baby: no    EDD:   LMP: 11/21/2009    EDD by LMP: 08/28/2010  1st [place] date: 03/08/2010 = 15  weeks, 2  days  EDD by 1st [place]: 08/28/2010  EDD: 08/28/2010  Today's EGA in weeks: 18 3/7    Dating Comments:  EDD by LMP.  ...................................................................Aretta Nip NP  Mar 09, 2010 7:38 AM     Symptoms:   Denies: back pain, change in discharge, constipation, fatigue, headache, heartburn, nausea, vomiting, pelvic pressure, preterm labor symptoms, swelling, uterine activity,  vaginal bleeding, loss of fluid, concerns for personal safety  Fetal movement: feeling something  Pain Scale: 1 out of 10    Prenatal Exam:     Weight    Pre-Pregnancy Weight: 154 lbs.    Exam   Est. Gestational Age: 31 3/7  Fundal height (cm): 2 below u  FHR: 160's  Edema: --    List of Diagnoses/Issues:   -transfer of care from [facility]        LABS REVIEWED    Initial Labs:   Blood Type: B (03/01/2010 11:02:00 AM)  D (Rh) Type: Positive (03/01/2010 11:02:00 AM)  Antibody Screen: Negative (03/01/2010 11:18:00 AM)  Hemoglobin: 12.4 (01/10/2010 4:20:31 PM)  Hematocrit: 37 (01/10/2010 4:20:31 PM)  MCV: 87 (01/10/2010 12:45:01 PM)  Pap Smear: 01/13/2010  ASCUS /c positive HPV (01/10/2010 4:20:31 PM)  Rubella: Immune (01/10/2010 4:20:31 PM)  RPR: NR (01/10/2010 4:20:31 PM)  Urine Culture: positive  HBsAg: NR (01/10/2010 4:20:31 PM)  HIV: NR (01/10/2010 4:20:31 PM)  Hemoglobin A1:  97.0 (01/10/2010  12:45:01 PM)  Hemoglobin F:  0.5 (01/10/2010 12:45:01 PM)  Hemoglobin A2:  2.5 (01/10/2010 12:45:01 PM)  Hemoglobin C:  0.0 (01/10/2010 12:45:01 PM)  Hemoglobin S:  0.0 (01/10/2010 12:45:01 PM)  Chlamydia: Negative (01/10/2010 4:20:31 PM)  Gonorrhea: negative (01/10/2010 4:20:31 PM)    [place] REVIEW  US# Date GA EstFetal Wt % Placenta Fluid Anatomy Recommendations Comments   1 03/08/2010 15 2/7wks   fundal   Comprehensive fetal survey at [employer]. IUP at 15 2/7wks with an EDD of 08/28/2010.   2            3            4            5            6                     Assessment   34 Years Old Female      G: 2  P 0:     # Living Children: 0  SAB: 1  GA: 18 3/7 weeks       EDD: 08/28/2010    Status of Existing Problems  Assessed PREGNANCY, MULTIGRAVIDA as comment only - Centering Session 2:  Common comforts of pregnancy  continue prenatal vitamins  colpo appt. scheduled  PTL/fetal activity discussed  RTC for centering session 3 - Skip Litke A Shem Plemmons CNP - Signed    Today's Orders   AFP Quad Screen  (AFPQ) [id number]) [CPT- 60454,09811...]    Disposition:   Clinic: Centering  Primary OB Provider: Videtto      Patient Education   1st Trimester Education:  HIV/Prenatal tests, prenatal history risk factors, course of prenatal care, exercise/nutrition/wt gain, toxoplasmosis (cat/raw meat), sexual activity, exercise, influenza vaccine, smoking counseling, environment/work hazards, travel/seatbelts, tobacco/alcohol/drugs, use of medications, indications for ultrasound, domestic violence, childbirth classes, hospital facilities        RN exit-  ROB /Centering session 2   See note from  Cyndia Bent, CNP  Has appt. for colposcopy 04/28/2010    To lab for AFPQ  RTC 4 wks....................................................................Willeen Niece RN  Mar 30, 2010 4:31 PM                        ]

## 2010-03-30 NOTE — Unmapped (Signed)
Signed by Arvil Chaco CNP on 04/05/2010 at 14:25:17  Patient: Veronica Rocha  Note: All result statuses are Final unless otherwise noted.    Tests: (1) MATERNAL SERUM SCREEN 4 (AFPQ)    Order Note: [address] [facility] - [address]  [place], [place] [id number]   [id number] CLIA #:[id number]  ! Marland Kitchen                         Comment  ! TEST RESULTS:             *Screen Negative*  ! Gest Age/LMP              18.4 WEEKS  ! Dating Method             Ultrasound                                    18.4 on 03/30/2010  ! Race                      Black  ! Maternal  Weight          159 lbs  ! Insulin Dep. Diabetic                              No  ! Number of Fetuses         No    Alpha Feto Protein        87.4 ng/mL    AFP MOM                   1.78    HCG, Quant                9882 mIU/mL    HCG MOM                   0.48    Estriol                   1.47 ng/mL    Estriol MOM               1.10  ! DIA Value                 81.57 pg/mL  ! DIA MoM                   0.47  ! OSBR Risk                 2681   DSR (Second Trimester)                              10000    DSR (By Age)              436  ! Trisomy 68 Ramon Dredge) Risk                              Not increased  ! T18 (By Age)              1:1698  ! Screen interpretation  Comment      Interpretation: Screen Negative      This result is screen negative for fetal OSB, Down Syndrome and      Trisomy 18. The AFP MoM and patient specific risks calculated are      based on the gestational age and the clinical information provided.      This test can identify up to 80% of open fetal neural tube defects.      Closed neural tube defects and some open defects may not be detected      by this test.  The combination of maternal age, AFP, hCG, uE3, and      DIA identifies 75-80% of fetal Down Syndrome.  The combination of      maternal age, AFP, hCG and uE3 identifies 60% of Trisomy 18      pregnancies.  The [employer]      recommends amniocentesis be offered to women age 96 and older.      Recalculations are not recommended when gestational dating by LMP and      ultrasound are within 10 days.    Note: An exclamation mark (!) indicates a result that was not dispersed into   the flowsheet.  Document Creation Date: 04/01/2010 2:27 AM  _______________________________________________________________________    (1) Order result status: Final  Collection or observation date-time: 03/30/2010 15:00  Requested date-time: 03/30/2010 15:00  Receipt date-time: 03/31/2010 02:56  Reported date-time: 04/01/2010 02:27  Referring Physician: Alvino Chapel ANN VIDETTO  Ordering Physician: Royetta Asal [facility])  Specimen Source: S&SERUM     SST R&SST (3 ml S Refrig)  Source: Faith Rogue Order Number: [id number]  Lab site: [facility]      [address]      [place] [address]  [phone]      -----------------    The following lab values were dispersed to the flowsheet  with no units conversion:      AFP MOM, 1.78, (F)  expected units: MoM    HCG MOM, 0.48, (F)  expected units: MoM    Estriol MOM, 1.10, (F)  expected units: MoM

## 2010-03-30 NOTE — Unmapped (Signed)
Signed by Arvil Chaco CNP on 03/30/2010 at 14:16:25  Patient: Veronica Rocha  Note: All result statuses are Final unless otherwise noted.    Tests: (1) POC URINALYSIS (POCUA)    Glucose, Urine            Negative mg/dL              Negative    Bilirubin, Urine          Negative                    Negative    Ketone                    Negative mg/dL              Negative    Specific Gravity          1.015                       1.005-1.035    Blood                     Negative                    Negative    pH                        7.0                         5.0-8.0    Protein, urine            Negative mg/dL              Negative    Urobilinogen              0.2 mg/dL                   3.3-2.9    Nitrite                   Negative                    Negative    Leukocyte Esterase        Negative                    Negative    Note: An exclamation mark (!) indicates a result that was not dispersed into   the flowsheet.  Document Creation Date: 03/30/2010 2:04 PM  _______________________________________________________________________    (1) Order result status: Final  Collection or observation date-time: 03/30/2010 14:01  Requested date-time: 03/30/2010 14:01  Receipt date-time: 03/30/2010 13:23  Reported date-time: 03/30/2010 14:04  Referring Physician: Royetta Asal  Ordering Physician: Alvino Chapel ANN VIDETTO (VIDETTJA)  Specimen Source: U&URINE     UACUP&URINALYSIS CONTAINER  Source: Faith Rogue Order Number: [id number]  Lab site: [facility]      [address]      [place] [address]  [phone]      -----------------    The following lab values were dispersed to the flowsheet  with no units conversion:      Urobilinogen, 0.2 MG/DL, (F)  expected units: E units/dL    -----------------    The following non-numeric lab results were dispersed to  the flowsheet even though numeric results were expected:  Glucose, Urine, Negative

## 2010-04-05 LAB — OFFICE VISIT LAB RESULTS
AFP MOM: 1.78 {M.o.M}
Estriol Mom: 1.1 {M.o.M}
HCG Mom: 0.48 {M.o.M}

## 2010-04-05 NOTE — Unmapped (Signed)
Signed by Arvil Chaco CNP on 04/05/2010 at 14:25:17      Initial Labs:   Blood Type: B (03/01/2010 11:02:00 AM)  D (Rh) Type: Positive (03/01/2010 11:02:00 AM)  Antibody Screen: Negative (03/01/2010 11:18:00 AM)  Hemoglobin: 12.4 (01/10/2010 4:20:31 PM)  Hematocrit: 37 (01/10/2010 4:20:31 PM)  MCV: 87 (01/10/2010 12:45:01 PM)  Pap Smear: 01/13/2010  ASCUS /c positive HPV (01/10/2010 4:20:31 PM)  Rubella: Immune (01/10/2010 4:20:31 PM)  RPR: NR (01/10/2010 4:20:31 PM)  Urine Culture: positive  HBsAg: NR (01/10/2010 4:20:31 PM)  HIV: NR (01/10/2010 4:20:31 PM)  Hemoglobin A1:  97.0 (01/10/2010 12:45:01 PM)  Hemoglobin F:  0.5 (01/10/2010 12:45:01 PM)  Hemoglobin A2:  2.5 (01/10/2010 12:45:01 PM)  Hemoglobin C:  0.0 (01/10/2010 12:45:01 PM)  Hemoglobin S:  0.0 (01/10/2010 12:45:01 PM)  Chlamydia: Negative (01/10/2010 4:20:31 PM)  Gonorrhea: negative (01/10/2010 4:20:31 PM)    8-20 Week Labs:   2nd Trimester AFP Only  Down's Risk 1: 10000  Trisomy 18 Risk 1: Not increased  Open Neural Tube Defect Risk 1: 2681  AFP MoM: 1.78  hCG MoM: 0.48  Inhibin A MoM: 0.47  E3 MoM: 1.10        Aneuploidy Risk   Date:   03/30/2010    2nd Trimester Aneuploidy Risk:    2nd Trimester AFP Only  Down's Risk 1: 10000  Trisomy 18 Risk 1: Not increased  ONTD Risk 1: 2681  AFP MoM: 1.78  HCG MoM: 0.48  Inhibin A MoM: 0.47  E3 MoM: 1.10    Recorded by: Arvil Chaco CNP on April 05, 2010 2:24 PM

## 2010-04-27 NOTE — Unmapped (Signed)
Signed by Willeen Niece RN on 04/27/2010 at 16:19:54    PRENATAL VISIT        Allergies  ! * PCN    Vital Signs   Height: 67 in.   Pre-Pregnancy Weight: 154 lbs.                   Menses History:   [facility] Delivery: [facility]  First day LMP: 11/21/2009  This date is: approximate  Menses monthly: yes  Frequency:  28 days  Menarche: 16  Contraception used at conception: None  Planned pregnancy: no  Pregnancy confirmed by: urine HCG  Date confirmed: 12/2009  G: 2  P 0:     # Living Children: 0  SAB: 1History of Multifetal Delivery: No      Other Pertinent History:   Pre-Pregnacy Tobacco Use per Day: 0  Pregnancy Tobacco Use per Day: 0  Pre-Pregnacy Alcohol Use per Day: 0  Pregnancy Alcohol Use per Day: 0  Pre-Pregnancy Illicit/Recreational Drug Use per Day: 0  Pregnancy Illicit/Recreational Drug Use per Day: 0  D(Rh) Sensitized: No  Latex Allergy: No  Betadine Allergy: No  Anesthetic Complications No  History of Abnormal Pap: Yes- colposcopy done in past  Uterine Anomaly: No  History of Infertility: No  DES Exposure: No  ART Treatment: No      Infection History:   Exposed to TB: no   History of Genital Herpes: no   Sexual partner w/hx of oral or genital HSV: no  Rash or Viral Illness since LMP: no  Hepatitis B: no  Vaccinated for Hepatits B: yes  Hepatitis C: no- unsure  History of Varicella Infection: yes  History of STD: yes- chlamydia treated in past  History of PID: no  Do you care for any cats: no    Genetic Testing/Teratology Counseling:   Patient's Age>= 35 Years as of Estimated Date of Delivery: no  Thalassemia Risk: no  Neural Tube Defect: no  Congenital Heart Defect: no   Down Syndrome: no  Tay-Sachs: no  Canavan Disease: no  Familial Dysautonomia: no  Sickle Cell Disease/Trait: no  Hemophilia/Blood Disorders: no  Muscular Dystrophy: no  Cystic Fibrosis: no  Huntington's Chorea: no  Mental Retardation/Autism no  Tested for Fragile X: no  Other Genetic or Chromosomal: no  Maternal Metabolic  Disorder: no  Other birth defects: no  Recurrent Pregnancy Loss/Stillbirth: no  Medications/Drugs/Alcohol since LMP: no- Agents and Strength/Dose: antibiotic for UTI  Previous Genetic testing for Cystic Fibrosis: no  Previous Genetic testing father of baby: no    Past Pregnancies:  G:  2 P:  0 A:    SAB: 1  EAB:    Term Deliveries:    Premature Deliveries:   Ectopic pregnancies:    H/O Multi-fetal Delivery:  No    Preg # Date Early Loss/ Termination GA Wks Lgth of Labor Brth Wt Sex Type of Del Anes Pl of Del PTL Fetal Comp Matrnl Comp Cmnts   1 1996 SAB 6             2 current               3                4                      EDD:   LMP: 11/21/2009    EDD by LMP: 08/28/2010  1st [place] date:  03/08/2010 = 15  weeks, 2  days  EDD by 1st [place]: 08/28/2010  EDD: 08/28/2010  Today's EGA in weeks: 22 3/7    Dating Comments:  EDD by LMP.  ...................................................................Aretta Nip NP  Mar 09, 2010 7:38 AM     Prenatal Exam:     Weight    Pre-Pregnancy Weight: 154 lbs.    Exam   Est. Gestational Age: 31 3/7    List of Diagnoses/Issues:   -transfer of care from [facility]      Initial Labs:   Blood Type: B (03/01/2010 11:02:00 AM)  D (Rh) Type: Positive (03/01/2010 11:02:00 AM)  Antibody Screen: Negative (03/01/2010 11:18:00 AM)  Hemoglobin: 12.4 (01/10/2010 4:20:31 PM)  Hematocrit: 37 (01/10/2010 4:20:31 PM)  MCV: 87 (01/10/2010 12:45:01 PM)  Pap Smear: 01/13/2010  ASCUS /c positive HPV (01/10/2010 4:20:31 PM)  Rubella: Immune (01/10/2010 4:20:31 PM)  RPR: NR (01/10/2010 4:20:31 PM)  Urine Culture: positive  HBsAg: NR (01/10/2010 4:20:31 PM)  HIV: NR (01/10/2010 4:20:31 PM)  Hemoglobin A1:  97.0 (01/10/2010 12:45:01 PM)  Hemoglobin F:  0.5 (01/10/2010 12:45:01 PM)  Hemoglobin A2:  2.5 (01/10/2010 12:45:01 PM)  Hemoglobin C:  0.0 (01/10/2010 12:45:01 PM)  Hemoglobin S:  0.0 (01/10/2010 12:45:01 PM)  Chlamydia: Negative (01/10/2010 4:20:31 PM)  Gonorrhea: negative (01/10/2010 4:20:31  PM)    8-20 Week Labs:   2nd Trimester AFP Only  Down's Risk 1: 10000  Trisomy 18 Risk 1: Not increased  Open Neural Tube Defect Risk 1: 2681  AFP MoM: 1.78 (?)  hCG MoM: 0.48 (?)  Inhibin A MoM: 0.47  E3 MoM: 1.10 (?)        Assessment   34 Years Old Female      G: 2  P 0:     # Living Children: 0  SAB: 1  GA: 22 3/7 weeks       EDD: 08/28/2010    Disposition:   [facility]  Primary OB Provider: Videtto      Patient Education   1st Trimester Education:  HIV/Prenatal tests, prenatal history risk factors, course of prenatal care, exercise/nutrition/wt gain, toxoplasmosis (cat/raw meat), sexual activity, exercise, influenza vaccine, smoking counseling, environment/work hazards, travel/seatbelts, tobacco/alcohol/drugs, use of medications, indications for ultrasound, domestic violence, childbirth classes, hospital facilities        Intrauterine Device Removal   GC Culture: negative    No show for [facility]  session 3  T/C to patient -  has moved to [place] and is recieving prenatal care there.  ...................................................................Willeen Niece RN  April 27, 2010 4:19 PM                ]

## 2011-11-06 HISTORY — PX: CERVICAL BIOPSY  W/ LOOP ELECTRODE EXCISION: SUR135

## 2014-11-05 HISTORY — PX: TUBAL LIGATION: SHX77

## 2016-04-20 ENCOUNTER — Inpatient Hospital Stay
Admit: 2016-04-20 | Discharge: 2016-04-21 | Disposition: A | Discharge status: Home / Self Care | Attending: Emergency Medicine

## 2016-04-20 DIAGNOSIS — J020 Streptococcal pharyngitis: Secondary | ICD-10-CM

## 2016-04-20 LAB — CBC WITH AUTO DIFFERENTIAL
Basophils %: 0.7 %
Basophils Absolute: 0 10*3/uL (ref 0.0–0.2)
Eosinophils %: 3.4 %
Eosinophils Absolute: 0.2 10*3/uL (ref 0.0–0.6)
Hematocrit: 38.9 % (ref 36.0–48.0)
Hemoglobin: 12.4 g/dL (ref 12.0–16.0)
Lymphocytes %: 20.7 %
Lymphocytes Absolute: 1.3 10*3/uL (ref 1.0–5.1)
MCH: 27.2 pg (ref 26.0–34.0)
MCHC: 31.9 g/dL (ref 31.0–36.0)
MCV: 85.2 fL (ref 80.0–100.0)
MPV: 9.7 fL (ref 5.0–10.5)
Monocytes %: 8.2 %
Monocytes Absolute: 0.5 10*3/uL (ref 0.0–1.3)
Neutrophils %: 67 %
Neutrophils Absolute: 4.3 10*3/uL (ref 1.7–7.7)
Platelets: 149 10*3/uL (ref 135–450)
RBC: 4.57 M/uL (ref 4.00–5.20)
RDW: 13.7 % (ref 12.4–15.4)
WBC: 6.4 10*3/uL (ref 4.0–11.0)

## 2016-04-20 LAB — BASIC METABOLIC PANEL
Anion Gap: 13 (ref 3–16)
BUN: 11 mg/dL (ref 7–20)
CO2: 26 mmol/L (ref 21–32)
Calcium: 9.5 mg/dL (ref 8.3–10.6)
Chloride: 99 mmol/L (ref 99–110)
Creatinine: 0.7 mg/dL (ref 0.6–1.1)
GFR African American: >60 (ref >60)
GFR Non-African American: >60 (ref >60)
Glucose: 82 mg/dL (ref 70–99)
Potassium: 4 mmol/L (ref 3.5–5.1)
Sodium: 138 mmol/L (ref 136–145)

## 2016-04-20 LAB — APTT: aPTT: 29.6 s (ref 21.0–31.8)

## 2016-04-20 LAB — MICROSCOPIC URINALYSIS
Epithelial Cells, UA: 4 /HPF (ref 0–5)
Hyaline Casts, UA: 2 /LPF (ref 0–8)
RBC, UA: 5 /HPF — ABNORMAL HIGH (ref 0–4)
WBC, UA: 1 /HPF (ref 0–5)

## 2016-04-20 LAB — STREP SCREEN GROUP A THROAT: Rapid Strep A Screen: POSITIVE — AB

## 2016-04-20 LAB — WET PREP, GENITAL
Bacteria: NONE SEEN
Clue Cells, Wet Prep: NONE SEEN
Trichomonas Prep: NONE SEEN
Yeast, Wet Prep: NONE SEEN

## 2016-04-20 LAB — URINALYSIS WITH REFLEX TO CULTURE
Bilirubin Urine: NEGATIVE
Glucose, Ur: NEGATIVE mg/dL
Ketones, Urine: NEGATIVE mg/dL
Leukocyte Esterase, Urine: NEGATIVE
Nitrite, Urine: NEGATIVE
Protein, UA: NEGATIVE mg/dL
Specific Gravity, UA: 1.023 (ref 1.005–1.030)
Urobilinogen, Urine: 0.2 E.U./dL (ref <2.0)
pH, UA: 5.5 (ref 5.0–8.0)

## 2016-04-20 LAB — PROTIME-INR
INR: 1.06 (ref 0.85–1.15)
Protime: 12 s (ref 9.6–13.0)

## 2016-04-20 LAB — PREGNANCY, URINE: HCG(Urine) Pregnancy Test: NEGATIVE

## 2016-04-20 MED ORDER — AZITHROMYCIN 250 MG PO TABS
250 MG | PACK | ORAL | 0 refills | Status: AC
Start: 2016-04-20 — End: 2016-04-30

## 2016-04-20 MED ORDER — KETOROLAC TROMETHAMINE 30 MG/ML IJ SOLN
30 MG/ML | Freq: Once | INTRAMUSCULAR | Status: DC
Start: 2016-04-20 — End: 2016-04-20

## 2016-04-20 MED ORDER — NAPROXEN 500 MG PO TABS
500 MG | ORAL_TABLET | Freq: Two times a day (BID) | ORAL | 0 refills | Status: AC
Start: 2016-04-20 — End: ?

## 2016-04-20 MED ORDER — ONDANSETRON 4 MG PO TBDP
4 MG | ORAL_TABLET | Freq: Three times a day (TID) | ORAL | 0 refills | Status: AC | PRN
Start: 2016-04-20 — End: ?

## 2016-04-20 MED FILL — KETOROLAC TROMETHAMINE 30 MG/ML IJ SOLN: 30 MG/ML | INTRAMUSCULAR | Qty: 1

## 2016-04-20 NOTE — ED Notes (Signed)
Pt states she has had vaginal bleeding for 3 weeks and has " worst odor" Ive ever had. Pt tolerated strep swab well. Pt refused pain shot.   No signs of distress. Unlabored breathing. Skin natural in color.  Pt up walking in room attending to toddler whom is with pt a stroller at bedside.       Montel CulverElizabeth Ann Carmello Cabiness, [place]RN  04/20/16 [phone]

## 2016-04-20 NOTE — ED Provider Notes (Signed)
As physician-in-triage, I performed a medical screening history and physical exam on this patient.  I also cared for and evaluated the patient in conjunction with the ED Advanced Practice Provider    HISTORY OF PRESENT ILLNESS  Learta CoddingSelina Attridge is a 40 y.o. female with a past medical history presents to ED with complaints of nausea and vaginal bleeding discomfort 3 weeks.  Patient states she is not consulted GYN because she just moved to the area.  Patient state she has some associated nausea but no vomiting.  Patient is a complaining of sore throat for last 3 days with some right-sided facial pain.  Denies fevers chills chest pain dyspnea vomiting no other pain or complaints     PHYSICAL EXAM  There were no vitals taken for this visit.    On exam, the patient appears well-hydrated, well-nourished, and in no acute distress. Mucous membranes are moist. Speech is clear. Breathing is unlabored.  Skin is dry. Mental status is normal. Moves all extremities, and is without facial droop.   AO ??3, nontoxic, oropharynx erythema, lymphadenopathy submandibular, postnasal drip, right-sided tonsil no exudates larger than left    All diagnostic, treatment, and disposition decisions were made by myself in conjunction with the advanced practice provider.    Patient will be evaluated in the ED and disposition as appropriate  For all further details of the patient's emergency department visit, please see the advanced practice provider's documentation.    Comment: Please note this report has been produced using speech recognition software and may contain errors related to that system including errors in grammar, punctuation, and spelling, as well as words and phrases that may be inappropriate. If there are any questions or concerns please feel free to contact the dictating provider for clarification.       Cletis Athensamara O Raynie Steinhaus, DO  04/20/16 1554

## 2016-04-20 NOTE — ED Provider Notes (Signed)
[facility]  eMERGENCY dEPARTMENT eNCOUnter        Pt Name: Katrina Diaz  MRN: [id number]  Birthdate 10/12/1976  Date of evaluation: 04/20/2016  Provider: Gala MurdochMacie E Drisana Schweickert, CNP  PCP: No primary care provider on file.  ED Attending: Cletis Athensamara O Tatunchak, DO    CHIEF COMPLAINT       Chief Complaint   Patient presents with   ??? Vaginal Bleeding     pt. c/o intermittent abdominal pain, vaginal bleeding for the past 3 weeks.   States that she also has sore throat to left side of throat as well for the past 3 days.       HISTORY OF PRESENT ILLNESS   (Location/Symptom, Timing/Onset, Context/Setting, Quality, Duration, Modifying Factors, Severity)  Note limiting factors.     Katrina Diaz is a 40 y.o. female who presents today with vaginal bleeding and discharge. States she began bleeding the 04/07/16, but is intermittent with alternating with red and brown bleeding. She states she is also having some lower pelvic pain and has had BV in the past. She states she has also had the sore throat for the three days. She denies any fever, chills, vomiting or diarrhea. She does complain of nausea.  She states that her abdominal cramping and sore throat are 5 out of 10.  She denies any difficulty eating or swallowing.  Denies concern for pregnancy.  Denies concern for STD.  She does report that she has a baby that is 559 months old.  She is denying any chest pain or shortness of breath.  She denies having taking any medicine for her symptoms.  Denies any additional complaints, no additional aggravating or alleviating factors.  The patient presents awake, alert and in no acute respiratory distress or toxic appearance.    Nursing Notes were all reviewed and agreed with or any disagreements were addressed  in the HPI.    REVIEW OF SYSTEMS    (2-9 systems for level 4, 10 or more for level 5)     Review of Systems   Constitutional: Negative for chills and fever.   HENT: Positive for sore throat. Negative for congestion.          Patient presents with sore throat ??3 days.  Denies any difficulty swallowing.  Pain is mostly in the left side.   Respiratory: Negative for cough, shortness of breath and wheezing.    Cardiovascular: Negative for chest pain.   Gastrointestinal: Positive for abdominal pain. Negative for diarrhea, nausea and vomiting.        Patient with the below abdominal cramping intermittently for the past 3 weeks when she is on her menstrual cycle.  He does complain of nausea, denies any vomiting or diarrhea.   Genitourinary: Positive for vaginal bleeding. Negative for difficulty urinating, dysuria, frequency and urgency.        Patient states she has had intermittent vaginal bleeding for the past 3 weeks, states it's coming and going.  Denies any excessive blood clots.   Musculoskeletal: Negative for back pain.   Skin: Negative for color change.   Neurological: Negative for weakness, numbness and headaches.       Positives and Pertinent negatives as per HPI.  Except as noted above in the ROS, all other systems were reviewed and negative.       PAST MEDICAL HISTORY   History reviewed. No pertinent past medical history.      SURGICAL HISTORY       Past Surgical  History:   Procedure Laterality Date   ??? CESAREAN SECTION     ??? TUBAL LIGATION      2016         CURRENT MEDICATIONS       Previous Medications    No medications on file         ALLERGIES     Penicillins    FAMILY HISTORY     History reviewed. No pertinent family history.       SOCIAL HISTORY       Social History     Social History   ??? Marital status: Single     Spouse name: N/A   ??? Number of children: N/A   ??? Years of education: N/A     Social History Main Topics   ??? Smoking status: Never Smoker   ??? Smokeless tobacco: None   ??? Alcohol use No   ??? Drug use: No   ??? Sexual activity: Not Asked     Other Topics Concern   ??? None     Social History Narrative   ??? None       SCREENINGS             PHYSICAL EXAM    (up to 7 for level 4, 8 or more for level 5)   ED Triage  Vitals   BP Temp Temp Source Pulse Resp SpO2 Height Weight   04/20/16 1542 04/20/16 1542 04/20/16 1542 04/20/16 1542 04/20/16 1542 04/20/16 1542 04/20/16 1542 04/20/16 1542   112/75 98.7 ??F (37.1 ??C) Oral 68 14 100 % 5\' 7"  (1.702 m) 153 lb 8 oz (69.6 kg)       Physical Exam   Constitutional: She is oriented to person, place, and time. She appears well-developed and well-nourished.   HENT:   Head: Normocephalic.   Right Ear: External ear normal.   Left Ear: External ear normal.   Mouth/Throat: Oropharynx is clear and moist.   Oropharyngeal pink and moist, tonsils are enlarged with exudate on the left side of her tonsil.  She has normal phonation, normal for a hot potato voice.  Bilateral tonsils are 1+ in size.  She does have some posterior pharynx nasal drip that is clear.  She is able to swallow without difficulty.   Eyes: Right eye exhibits no discharge. Left eye exhibits no discharge.   Neck: Normal range of motion. Neck supple.   Patient has normal flexion and extension of head and neck, no nuchal rigidity or meningeal irritation.  No anterior posterior cervical lymphadenopathy.   Cardiovascular: Normal rate.    Patient has normal S1 and 2, peripheral pulses 2+, no edema observed.   Pulmonary/Chest: Effort normal and breath sounds normal. No respiratory distress.   Abdominal: Soft. Bowel sounds are normal. There is no tenderness. There is no guarding.   Musculoskeletal: Normal range of motion.   Lymphadenopathy:     She has no cervical adenopathy.   Neurological: She is alert and oriented to person, place, and time. GCS eye subscore is 4. GCS verbal subscore is 5. GCS motor subscore is 6.   Skin: Skin is warm and dry. She is not diaphoretic. No pallor.   Psychiatric: She has a normal mood and affect. Her behavior is normal.   Nursing note and vitals reviewed.      DIAGNOSTIC RESULTS   LABS:    Labs Reviewed   STREP SCREEN GROUP A THROAT - Abnormal; Notable for the following:  Result Value    Rapid Strep A  Screen POSITIVE (*)     All other components within normal limits   URINE RT REFLEX TO CULTURE - Abnormal; Notable for the following:     Blood, Urine LARGE (*)     All other components within normal limits   MICROSCOPIC URINALYSIS - Abnormal; Notable for the following:     RBC, UA 5 (*)     All other components within normal limits   WET PREP, GENITAL   C.TRACHOMATIS N.GONORRHOEAE DNA   CBC WITH AUTO DIFFERENTIAL   BASIC METABOLIC PANEL   PROTIME-INR   PREGNANCY, URINE   APTT       All other labs were within normal range or not returned as of this dictation.    EKG: All EKG's are interpreted by the Emergency Department Physician who either signs or Co-signs this chart in the absence of a cardiologist.  Please see their note for interpretation of EKG.      RADIOLOGY:   Non-plain film images such as CT, Ultrasound and MRI are read by the radiologist. Plain radiographic images are visualized and preliminarily interpreted by the  ED Provider with the below findings:        Interpretation per the Radiologist below, if available at the time of this note:    No orders to display     No results found.      PROCEDURES   Unless otherwise noted below, none     Procedures  Pelvic exam: VULVA: normal appearing vulva with no masses, tenderness or lesions, VAGINA: normal appearing vagina with normal color and discharge, no lesions, CERVIX: lesions absent, cervical discharge brownish red in color without odor, WET MOUNT and DNA probe for chlamydia and GC obtained, cervical motion tenderness absent, UTERUS: uterus is normal size, shape, consistency and nontender, ADNEXA: normal adnexa in size, nontender and no masses, exam chaperoned by Duwayne Heck, EDT.    CRITICAL CARE TIME   N/A    CONSULTS:  None      EMERGENCY DEPARTMENT COURSE and DIFFERENTIAL DIAGNOSIS/MDM:   Vitals:    Vitals:    04/20/16 1542   BP: 112/75   Pulse: 68   Resp: 14   Temp: 98.7 ??F (37.1 ??C)   TempSrc: Oral   SpO2: 100%   Weight: 153 lb 8 oz (69.6 kg)   Height: 5'  7" (1.702 m)       Patient was given the following medications:  Medications   ketorolac (TORADOL) injection 30 mg (30 mg Intravenous Not Given 04/20/16 1837)       Patient presents emergency Department with 2 complaints, complains of intermittent abdominal cramping and vaginal bleeding for the past 3 weeks.  States that the bleeding comes and goes.  Patient is also complaining of sore throat for the past 3 days.  Denies any, does complain of nausea without vomiting or diarrhea.    After evaluation and examination the patient the provider triage initiated protocols.  Rapid strep is positive.  I did perform a pelvic exam, see procedure note.  CBC shows a acute sepsis or anemia.  Metabolic panel shows no acute electrolyte disturbances or renal insufficiency.  Urinalysis shows no acute infection.  Wet prep is negative for Trichomonas, yeast and clue cells.  Upon reevaluation the vital signs remained stable.  She has no airway compromise.  I did educate her that I would discharge her home on antibiotics however that she states she just received insurance and needs  a family doctor and ObGyn.  However, no concerns for threatened miscarriage, ectopic pregnancy, peritonsillar abscess, epiglottitis, pneumonia or other emergent etiologies.    Therefore, shared medical decision was made between the attending physician, the patient myself and we agreed the patient be discharged home with outpatient follow-up.  Patient was given azithromycin for her strep positive.  She was also given Naprosyn and Zofran for her symptoms.  She was given a referral to family doctor and Melrose Nakayama and was told to call for an appointment on Monday and return for worsening symptoms.  Overall, the patient tolerated their visit well.  They were seen and evaluated by the attending physician, Cletis Athens, DO who agreed with the assessment and plan.  The patient and / or the family were informed of the results of any tests, a time was given to answer  questions, a plan was proposed and they agreed with plan.  Patient verbalizes understanding of discharge instructions and was discharged from the department in stable condition.      FINAL IMPRESSION      1. Streptococcal sore throat    2. Vaginal bleeding between periods    3. Abdominal pain, left lower quadrant    4. Nausea without vomiting          DISPOSITION/PLAN   DISPOSITION Decision to Discharge    PATIENT REFERRED TO:  Rocky Link, CNP  [address]  [place] [address]  [phone]    Schedule an appointment as soon as possible for a visit in 2 days  this is a family doctor referral, follow up on Monday    [facility]  [address]  [address]  [place] [address]  [phone]  Schedule an appointment as soon as possible for a visit in 2 days  This is an ObGyn referral, please call on Monday make an appointment    [facility]  [address]  [place] [address]  [phone]  Go to  If symptoms worsen      DISCHARGE MEDICATIONS:  New Prescriptions    AZITHROMYCIN (ZITHROMAX) 250 MG TABLET    Take 2 tablets (500 mg) on Day 1, followed by 1 tablet (250 mg) once daily on Days 2 through 5.    NAPROXEN (NAPROSYN) 500 MG TABLET    Take 1 tablet by mouth 2 times daily    ONDANSETRON (ZOFRAN ODT) 4 MG DISINTEGRATING TABLET    Take 1 tablet by mouth every 8 hours as needed for Nausea       DISCONTINUED MEDICATIONS:  Discontinued Medications    No medications on file              (Please note that portions of this note were completed with a voice recognition program.  Efforts were made to edit the dictations but occasionally words are mis-transcribed.)    Gala Murdoch, CNP (electronically signed)         Gala Murdoch, CNP  04/20/16 1941       Kathrin Greathouse Fraser Busche, CNP  04/20/16 1945

## 2016-04-23 LAB — C.TRACHOMATIS N.GONORRHOEAE DNA
C. trachomatis DNA: NEGATIVE
N. gonorrhoeae DNA: NEGATIVE

## 2016-06-20 ENCOUNTER — Ambulatory Visit: Payer: PRIVATE HEALTH INSURANCE

## 2016-06-28 ENCOUNTER — Ambulatory Visit: Admit: 2016-06-28 | Discharge: 2016-06-28 | Payer: PRIVATE HEALTH INSURANCE

## 2016-06-28 ENCOUNTER — Ambulatory Visit: Admit: 2016-06-28 | Payer: PRIVATE HEALTH INSURANCE

## 2016-06-28 DIAGNOSIS — N926 Irregular menstruation, unspecified: Secondary | ICD-10-CM

## 2016-06-28 DIAGNOSIS — Z01419 Encounter for gynecological examination (general) (routine) without abnormal findings: Secondary | ICD-10-CM

## 2016-06-28 LAB — CBC
Hematocrit: 36.4 % (ref 35.0–45.0)
Hemoglobin: 11.8 g/dL (ref 11.7–15.5)
MCH: 27.4 pg (ref 27.0–33.0)
MCHC: 32.5 g/dL (ref 32.0–36.0)
MCV: 84.2 fL (ref 80.0–100.0)
MPV: 9.8 fL (ref 7.5–11.5)
Platelets: 166 10E3/uL (ref 140–400)
RBC: 4.33 10E6/uL (ref 3.80–5.10)
RDW: 13.9 % (ref 11.0–15.0)
WBC: 3.9 10E3/uL (ref 3.8–10.8)

## 2016-06-28 LAB — HPV HIGH RISK WITH GENOTYPING
HPV DNA High Risk Oth: NEGATIVE
HPV Genotype 16: NEGATIVE
HPV Genotype 18: NEGATIVE

## 2016-06-28 LAB — THYROID FUNCTION CASCADE: TSH: 0.75 u[IU]/mL (ref 0.34–5.60)

## 2016-06-28 LAB — DHEA-SULFATE: DHEA Sulfate: 117 ug/dL (ref 23–266)

## 2016-06-28 LAB — 17-HYDROXYPROGESTERONE: 17-Hydroxyprogesterone: 31 ng/dL

## 2016-06-28 LAB — ESTRADIOL (SENSITIVE): Estradiol: 22 pg/mL

## 2016-06-28 LAB — CHLAMYDIA/GONORRHOEAE DNA THIN PREP
Chlamydia Trachomatis DNA: NEGATIVE
Neisseria Gonorrhoeae DNA: NEGATIVE

## 2016-06-28 LAB — TESTOSTERONE: Testosterone: 44 ng/dL (ref 0–75)

## 2016-06-28 LAB — TREPONEMA PALLIDUM AB WITH REFLEX: Treponema Pallidum: NEGATIVE

## 2016-06-28 LAB — HEMOGLOBIN A1C: Hemoglobin A1C: 4.8 % (ref 4.8–6.4)

## 2016-06-28 LAB — HIV 1+2 ANTIBODY/ANTIGEN WITH REFLEX: HIV 1+2 AB/AGN: NONREACTIVE

## 2016-06-28 NOTE — Unmapped (Signed)
Call [phone] to schedule your pelvic ultrasound

## 2016-06-28 NOTE — Unmapped (Signed)
Gynecology Annual Exam    HPI: Ms Veronica Rocha is a 40 y.o. [id number] who presents for annual exam.     Patient reports desires STD testing.    Menstrual history:  Irregular, unpredictable  Occur anywhere from 1-2 months  Last for 5 days, though rarely up to 2 weeks  Rare cramping.    Patient notes weight loss (secondary to stress and decreased appetite) currently 152 down from 158lbs.  No facial hair growth  Reports increased acne (had attributed to changes to laundry detergent)    She is using nothing for contraception.   Patient has no trouble with intercourse.     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 No followup since  Immunizations reports up to date    History reviewed. No pertinent past medical history.    Past Surgical History   Procedure Laterality Date   ??? Cesarean section       x2   ??? Cervical biopsy  w/ loop electrode excision  2013   ??? Vaginal wound closure / repair       trauma at age 13       Social History     Social History   ??? Marital Status: Single     Spouse Name: N/A   ??? Number of Children: N/A   ??? Years of Education: N/A     Occupational History   ??? Not on file.     Social History Main Topics   ??? Smoking status: Never Smoker    ??? Smokeless tobacco: Not on file   ??? Alcohol Use: Yes   ??? Drug Use: No   ??? Sexual Activity:     Partners: Male     Pharmacist, hospital Protection: None     Other Topics Concern   ??? Caffeine Use Yes   ??? Occupational Exposure Yes   ??? Exercise Yes   ??? Seat Belt Yes     Social History Narrative   ??? No narrative on file       Family History   Problem Relation Age of Onset   ??? Breast cancer Neg Hx    ??? Clotting disorder Neg Hx    ??? Colon cancer Neg Hx    ??? Ovarian cancer Neg Hx        Medications: reviewed medication  list in the chart  Allergies: reviewed allergy section in the chart    Review of Systems - General ROS: negative for - chills, fatigue, fever or hot flashes  Hematological and Lymphatic ROS: negative for - blood clots, night sweats or unexpected weight changes  Breast ROS: negative for breast lumps  Respiratory ROS: no cough, shortness of breath, or wheezing  Cardiovascular ROS: no chest pain or dyspnea on exertion  Gastrointestinal ROS: no abdominal pain, change in bowel habits, or black or bloody stools  Genito-Urinary ROS: no dysuria, trouble voiding, or hematuria  Musculoskeletal ROS: negative for - joint pain, joint  stiffness or muscular weakness  Neurological ROS: no TIA or stroke symptoms    Physical Exam:  BP 114/66 mmHg   Pulse 80   Resp 18   Ht 5' 7 (1.702 m)   Wt 152 lb 12.8 oz (69.31 kg)   BMI 23.93 kg/m2   SpO2 99%   LMP  (LMP Unknown)  OBJECTIVE:     Gen: A&Ox3, NAD.   HEENT: Atraumatic, normocephalic, Moist oral mucosa, Neck supple. No thyromegaly or supraclavicular/cervical lymphadenopathy  Lungs: CTAB, no wheezes, rhonchi or rales.  CV: RRR, no murmurs  Abd: soft, non tender, non distended. No guarding, rebound, hernias, masses, or hepatosplenomegaly  Ext: No LE edema, non tender, normal peripheral pulses  Neuro: grossly normal, no focal findings.    BREAST EXAM: breasts appear normal, no suspicious masses, no skin or nipple changes or axillary nodes  PELVIC EXAM: Normal external genitalia without lesions. Pink vaginal mucosa with minimal discharge. Cervix grossly normal in appearance. On bimanual exam, ~10 week sized anteverted uterus without enlargement, no CMT or adnexal masses. Normal appearing rectum.   No inguinal lymphadenopathy bilaterally.       A/P: 40 y.o. [id number] here for annual exam    Cervical cancer screening guidelines discussed, will obtain pap with cotesting.    Contraception: declines at this time    STD screening: desires; ordered    Recommended PNV    Reviewed healthy  diet/lifestyle activities. Encouraged exercise.    Irregular menses: accompanied with weight changes, increased acne. PCOS labs ordered. Pelvic ultrasound ordered. UPT negative.    Veronica Rocha was seen today for gynecologic exam.    Diagnoses and all orders for this visit:    Routine gynecological examination    Cervical cancer screening  -     Cytology - PAP Test, Routine Scrn    Irregular menses  -     TSH, Reflex FT4; Future  -     CBC; Future  -     17-Hydroxyprogesterone; Future  -     Hemoglobin A1c; Future  -     Testosterone, Total; Future  -     US Pelvis non-OB complete; Future  -     DHEA-sulfate, serum; Future  -     Estradiol; Future  -     US Transvaginal; Future    Screen for sexually transmitted diseases  -     Syphilis Screening Daune Perch); Future  -     HIV 1+2 Antibody/Antigen with Reflex; Future    Dispo: return in 2-3 weeks to discuss ultrasound/lab results    Reinaldo Raddle, MD

## 2016-08-15 ENCOUNTER — Ambulatory Visit: Admit: 2016-08-16 | Payer: PRIVATE HEALTH INSURANCE

## 2016-08-15 ENCOUNTER — Ambulatory Visit: Payer: PRIVATE HEALTH INSURANCE

## 2016-08-15 ENCOUNTER — Ambulatory Visit: Admit: 2016-08-15 | Discharge: 2016-08-15 | Payer: PRIVATE HEALTH INSURANCE

## 2016-08-15 ENCOUNTER — Other Ambulatory Visit: Admit: 2016-08-15 | Payer: PRIVATE HEALTH INSURANCE

## 2016-08-15 DIAGNOSIS — B9689 Other specified bacterial agents as the cause of diseases classified elsewhere: Secondary | ICD-10-CM

## 2016-08-15 DIAGNOSIS — N926 Irregular menstruation, unspecified: Secondary | ICD-10-CM

## 2016-08-15 LAB — CBC
Hematocrit: 39.2 % (ref 35.0–45.0)
Hemoglobin: 12.8 g/dL (ref 11.7–15.5)
MCH: 27 pg (ref 27.0–33.0)
MCHC: 32.5 g/dL (ref 32.0–36.0)
MCV: 83 fL (ref 80.0–100.0)
MPV: 9.6 fL (ref 7.5–11.5)
Platelets: 145 10*3/uL (ref 140–400)
RBC: 4.72 10*6/uL (ref 3.80–5.10)
RDW: 13.7 % (ref 11.0–15.0)
WBC: 4.3 10*3/uL (ref 3.8–10.8)

## 2016-08-15 LAB — THYROID FUNCTION CASCADE: TSH: 0.68 u[IU]/mL (ref 0.34–5.60)

## 2016-08-15 LAB — HCG, QUANTITATIVE, PREGNANCY: hCG Quant: 1.2 m[IU]/mL

## 2016-08-15 LAB — FOLLICLE STIMULATING HORMONE: FSH: 48.2 m[IU]/mL

## 2016-08-15 LAB — LUTEINIZING HORMONE: LH: 45.1 m[IU]/mL

## 2016-08-15 MED ORDER — metroNIDAZOLE (FLAGYL) 500 MG tablet
500 | ORAL_TABLET | Freq: Two times a day (BID) | ORAL | 0 refills | Status: AC
Start: 2016-08-15 — End: 2016-08-22

## 2016-08-15 NOTE — Progress Notes (Signed)
CC: vaginal irritation    Pt is a 40 y.o. [id number] with one week hx of lower pelvic pressure and D/C, and irritation.No N/V, F/C. Also irregular menses over the past few months, none in 1-2 months. No BC  Past medical and surgical history reviewed    Medications and allergies updated    BP 113/73   Pulse 71   Wt 152 lb 6.4 oz (69.1 kg)   SpO2 98%   BMI 23.87 kg/m   AAO X 3  Abd-s/nt  Pelvic exam: normal external genitalia, vulva, vagina, cervix, uterus and adnexa.    A/P: 1. BV-Flagl  -BV panel  -CX  2. Irregular menses  -labs  -usg    Kirtland Bouchard, MD

## 2016-08-16 LAB — BACTERIAL VAGINOSIS PANEL
Candida Species: NEGATIVE
Gardnerella vaginosis: NEGATIVE
Trichomonas vaginosis: NEGATIVE

## 2016-08-16 LAB — CHLAMYDIA / GONORRHOEAE DNA SWAB
Chlamydia Trachomatis DNA Swab: NEGATIVE
Neisseria gonorrhoeae DNA Swab: NEGATIVE

## 2016-08-22 NOTE — Telephone Encounter (Signed)
Patient informed of negative BV and cultures.  Per Dr. Thresa Ross she was advised to follow up on other labs.  Patient states she would call back to schedule an appointment.

## 2016-08-22 NOTE — Unmapped (Signed)
-----   Message from Donell Sievert IV sent at 08/19/2016 11:09 PM EDT -----  Please notify of negative BV panel and CX. She will need F/U for other labs.[id number]

## 2017-02-11 ENCOUNTER — Encounter (HOSPITAL_COMMUNITY): Payer: Self-pay

## 2017-02-11 DIAGNOSIS — Z79899 Other long term (current) drug therapy: Secondary | ICD-10-CM | POA: Diagnosis not present

## 2017-02-11 DIAGNOSIS — T7840XA Allergy, unspecified, initial encounter: Secondary | ICD-10-CM | POA: Insufficient documentation

## 2017-02-11 NOTE — ED Triage Notes (Signed)
Pt reports hives after staking sulfur antibiotic several days ago. She stopped taking antibiotic and took a benadryl. Pt reports rash went away but she is still itchy all over. She states she also recieved antibiotics after being treated for possible sti. Since taking these antibiotic she feels like "my soul is separating from my body" and absent minded. PT is tearful in triage. NAD. VSS

## 2017-02-12 ENCOUNTER — Emergency Department (HOSPITAL_COMMUNITY)
Admission: EM | Admit: 2017-02-12 | Discharge: 2017-02-12 | Disposition: A | Discharge status: Home / Self Care | Payer: Medicaid Other | Attending: Emergency Medicine | Admitting: Emergency Medicine

## 2017-02-12 DIAGNOSIS — T7840XA Allergy, unspecified, initial encounter: Secondary | ICD-10-CM

## 2017-02-12 MED ORDER — FAMOTIDINE 20 MG PO TABS
20.0000 mg | ORAL_TABLET | Freq: Once | ORAL | Status: AC
  Administered 2017-02-12: 20 mg via ORAL
  Filled 2017-02-12: qty 1

## 2017-02-12 MED ORDER — FAMOTIDINE 20 MG PO TABS: 20.0000 mg | ORAL_TABLET | Freq: Two times a day (BID) | ORAL | 0 refills | Status: DC

## 2017-02-12 MED ORDER — LORATADINE 10 MG PO TABS: 10.0000 mg | ORAL_TABLET | Freq: Every day | ORAL | 0 refills | Status: DC

## 2017-02-12 MED ORDER — PREDNISONE 10 MG PO TABS: 20.0000 mg | ORAL_TABLET | Freq: Two times a day (BID) | ORAL | 0 refills | Status: DC

## 2017-02-12 MED ORDER — PREDNISONE 20 MG PO TABS
40.0000 mg | ORAL_TABLET | Freq: Once | ORAL | Status: AC
  Administered 2017-02-12: 40 mg via ORAL
  Filled 2017-02-12: qty 2

## 2017-02-12 NOTE — Discharge Instructions (Signed)
Do not take sulfa drugs. Take the medication for allergic reaction. Return if symptoms worsen.  You may take Benadryl in addition if needed for itching.

## 2017-02-12 NOTE — ED Notes (Signed)
Pt verbalized understanding discharge instructions and denies any further needs or questions at this time. VS stable, ambulatory and steady gait.   

## 2017-02-12 NOTE — ED Provider Notes (Signed)
[facility]-EMERGENCY DEPT Provider Note   CSN: [id number] Arrival date & time: 02/11/17  2045     History   Chief Complaint Chief Complaint  Patient presents with  . Allergic Reaction    HPI Donna Stark is a 41 y.o. female who presents to the ED with a rash and itching after taking Bactrim. She stopped taking the medication and the rash improved but she continues to have itching. She denies swelling of her throat, difficulty swallowing or any respiratory symptoms.  The history is provided by the patient. No language interpreter was used.  Allergic Reaction  Presenting symptoms: itching and rash   Presenting symptoms: no difficulty breathing, no difficulty swallowing and no wheezing  Swelling: initially but not now.   Severity:  Mild Prior allergic episodes:  Allergies to medications Context: medications   Relieved by:  Antihistamines Exacerbated by: heat.   History reviewed. No pertinent past medical history.  There are no active problems to display for this patient.   Past Surgical History:  Procedure Laterality Date  . CESAREAN SECTION      OB History    No data available       Home Medications    Prior to Admission medications   Medication Sig Start Date End Date Taking? Authorizing Provider  famotidine (PEPCID) 20 MG tablet Take 1 tablet (20 mg total) by mouth 2 (two) times daily. 02/12/17   Hassell Patras Orlene Och, NP  loratadine (CLARITIN) 10 MG tablet Take 1 tablet (10 mg total) by mouth daily. 02/12/17   Ambrose Wile Orlene Och, NP  predniSONE (DELTASONE) 10 MG tablet Take 2 tablets (20 mg total) by mouth 2 (two) times daily with a meal. 02/12/17   Jennyfer Nickolson Orlene Och, NP    Family History No family history on file.  Social History Social History  Substance Use Topics  . Smoking status: Never Smoker  . Smokeless tobacco: Never Used  . Alcohol use No     Allergies   Sulfur   Review of Systems Review of Systems  Constitutional: Negative for chills and fever.  HENT: Negative  for trouble swallowing.   Eyes: Negative for redness.  Respiratory: Negative for wheezing.   Gastrointestinal: Negative for nausea and vomiting.  Musculoskeletal: Negative for joint swelling.  Skin: Positive for itching and rash.  Neurological: Negative for headaches.  Psychiatric/Behavioral: The patient is not nervous/anxious.      Physical Exam Updated Vital Signs BP 108/70 (BP Location: Left Arm)   Pulse 79   Temp 98 F (36.7 C) (Oral)   Resp 16   Ht  (1.702 m)   Wt 71.7 kg   LMP 12/06/2016   SpO2 98%   BMI 24.75 kg/m   Physical Exam  Constitutional: She is oriented to person, place, and time. She appears well-developed and well-nourished.  HENT:  Head: Normocephalic.  Mouth/Throat: Uvula is midline, oropharynx is clear and moist and mucous membranes are normal.  Eyes: EOM are normal.  Neck: Neck supple.  Cardiovascular: Normal rate and regular rhythm.   Pulmonary/Chest: Effort normal. No respiratory distress. She has no wheezes. She has no rales.  Musculoskeletal: Normal range of motion.  Neurological: She is alert and oriented to person, place, and time. No cranial nerve deficit.  Skin: Skin is warm and dry. Rash noted.  Generalized fine pruritic rash  Psychiatric: She has a normal mood and affect. Her behavior is normal.  Nursing note and vitals reviewed.    ED Treatments / Results  Labs (all labs  ordered are listed, but only abnormal results are displayed) Labs Reviewed - No data to display  Radiology No results found.  Procedures Procedures (including critical care time)  Medications Ordered in ED Medications  predniSONE (DELTASONE) tablet 40 mg (40 mg Oral Given 02/12/17 0050)  famotidine (PEPCID) tablet 20 mg (20 mg Oral Given 02/12/17 0050)     Initial Impression / Assessment and Plan / ED Course  I have reviewed the triage vital signs and the nursing notes.  Final Clinical Impressions(s) / ED Diagnoses  41 y.o. female with rash and  itching after starting sulfa drug stable for d/c without respiratory symptoms and no difficulty swallowing. Discussed with the patient that this allergy will be to any drug containing sulfa. Will treat for allergic reaction and itching. Return precautions.  Final diagnoses:  Allergic reaction to drug, initial encounter    New Prescriptions Discharge Medication List as of 02/12/2017 12:37 AM    START taking these medications   Details  famotidine (PEPCID) 20 MG tablet Take 1 tablet (20 mg total) by mouth 2 (two) times daily., Starting Tue 02/12/2017, Print    loratadine (CLARITIN) 10 MG tablet Take 1 tablet (10 mg total) by mouth daily., Starting Tue 02/12/2017, Print    predniSONE (DELTASONE) 10 MG tablet Take 2 tablets (20 mg total) by mouth 2 (two) times daily with a meal., Starting Tue 02/12/2017, Print         [place], NP 02/14/17 1512    Shon Baton, MD 02/16/17 1818

## 2017-03-08 ENCOUNTER — Ambulatory Visit (HOSPITAL_COMMUNITY)
Admission: EM | Admit: 2017-03-08 | Discharge: 2017-03-08 | Disposition: A | Discharge status: Home / Self Care | Payer: Medicaid Other | Attending: Family Medicine | Admitting: Family Medicine

## 2017-03-08 ENCOUNTER — Encounter (HOSPITAL_COMMUNITY): Payer: Self-pay | Admitting: Emergency Medicine

## 2017-03-08 DIAGNOSIS — J029 Acute pharyngitis, unspecified: Secondary | ICD-10-CM

## 2017-03-08 MED ORDER — LIDOCAINE VISCOUS 2 % MT SOLN: OROMUCOSAL | 0 refills | Status: DC

## 2017-03-08 MED ORDER — AZITHROMYCIN 250 MG PO TABS: 250.0000 mg | ORAL_TABLET | Freq: Every day | ORAL | 0 refills | Status: DC

## 2017-03-08 NOTE — ED Provider Notes (Signed)
CSN: [id number]     Arrival date & time 03/08/17  1736 History   None    Chief Complaint  Patient presents with  . Sore Throat  . Cough   (Consider location/radiation/quality/duration/timing/severity/associated sxs/prior Treatment) Patient c/o sore throat and fever for 4 days   The history is provided by the patient.  Sore Throat  This is a new problem. The problem occurs constantly. The problem has not changed since onset.Nothing aggravates the symptoms.  Cough  Associated symptoms: sore throat     History reviewed. No pertinent past medical history. Past Surgical History:  Procedure Laterality Date  . CESAREAN SECTION     History reviewed. No pertinent family history. Social History  Substance Use Topics  . Smoking status: Never Smoker  . Smokeless tobacco: Never Used  . Alcohol use No   OB History    No data available     Review of Systems  Constitutional: Negative.   HENT: Positive for sore throat.   Eyes: Negative.   Respiratory: Positive for cough.   Cardiovascular: Negative.   Gastrointestinal: Negative.   Endocrine: Negative.   Genitourinary: Negative.   Musculoskeletal: Negative.   Allergic/Immunologic: Negative.   Hematological: Negative.   Psychiatric/Behavioral: Negative.     Allergies  Sulfur and Penicillins  Home Medications   Prior to Admission medications   Medication Sig Start Date End Date Taking? Authorizing Provider  azithromycin (ZITHROMAX) 250 MG tablet Take 1 tablet (250 mg total) by mouth daily. Take first 2 tablets together, then 1 every day until finished. 03/08/17   Deatra CanterWilliam J Oxford, FNP  lidocaine (XYLOCAINE) 2 % solution Take 10 ml po q 4 hours prn 03/08/17   Deatra CanterWilliam J Oxford, FNP   Meds Ordered and Administered this Visit  Medications - No data to display  BP 115/62 (BP Location: Right Arm)   Pulse 69   Temp 98.2 F (36.8 C) (Oral)   Resp 18   SpO2 99%  No data found.   Physical Exam  Constitutional: She appears  well-developed and well-nourished.  HENT:  Head: Normocephalic.  Right Ear: External ear normal.  Left Ear: External ear normal.  OPX - erythematous tonsils 2 plus  Eyes: Conjunctivae and EOM are normal. Pupils are equal, round, and reactive to light.  Cardiovascular: Normal rate, regular rhythm and normal heart sounds.   Pulmonary/Chest: Effort normal and breath sounds normal.  Abdominal: Soft. Bowel sounds are normal.  Lymphadenopathy:    She has cervical adenopathy.  Nursing note and vitals reviewed.   Urgent Care Course     Procedures (including critical care time)  Labs Review Labs Reviewed - No data to display  Imaging Review No results found.   Visual Acuity Review  Right Eye Distance:   Left Eye Distance:   Bilateral Distance:    Right Eye Near:   Left Eye Near:    Bilateral Near:         MDM   1. Acute pharyngitis, unspecified etiology    Zpak as directed Lidocaine Push po fluids, rest, tylenol and motrin otc prn as directed for fever, arthralgias, and myalgias.  Follow up prn if sx's continue or persist.    Deatra CanterWilliam J Oxford, FNP 03/08/17 [phone]

## 2017-03-08 NOTE — ED Triage Notes (Signed)
The patient presented [facility] the [facility] with a complaint of a cough and sore throat x 4 days. 

## 2017-03-15 ENCOUNTER — Ambulatory Visit (INDEPENDENT_AMBULATORY_CARE_PROVIDER_SITE_OTHER): Payer: Medicaid Other | Admitting: Physician Assistant

## 2017-03-26 ENCOUNTER — Encounter (INDEPENDENT_AMBULATORY_CARE_PROVIDER_SITE_OTHER): Payer: Self-pay | Admitting: Physician Assistant

## 2017-03-26 ENCOUNTER — Other Ambulatory Visit (HOSPITAL_COMMUNITY)
Admission: RE | Admit: 2017-03-26 | Discharge: 2017-03-26 | Disposition: A | Discharge status: Home / Self Care | Payer: Medicaid Other | Source: Ambulatory Visit | Attending: Physician Assistant | Admitting: Physician Assistant

## 2017-03-26 ENCOUNTER — Ambulatory Visit (INDEPENDENT_AMBULATORY_CARE_PROVIDER_SITE_OTHER): Payer: Medicaid Other | Admitting: Physician Assistant

## 2017-03-26 VITALS — BP 105/70 | HR 72 | Temp 97.9°F | Wt 159.2 lb

## 2017-03-26 DIAGNOSIS — R350 Frequency of micturition: Secondary | ICD-10-CM | POA: Diagnosis not present

## 2017-03-26 DIAGNOSIS — R112 Nausea with vomiting, unspecified: Secondary | ICD-10-CM | POA: Insufficient documentation

## 2017-03-26 DIAGNOSIS — R631 Polydipsia: Secondary | ICD-10-CM | POA: Insufficient documentation

## 2017-03-26 DIAGNOSIS — Z9851 Tubal ligation status: Secondary | ICD-10-CM

## 2017-03-26 DIAGNOSIS — Z113 Encounter for screening for infections with a predominantly sexual mode of transmission: Secondary | ICD-10-CM | POA: Diagnosis not present

## 2017-03-26 DIAGNOSIS — N926 Irregular menstruation, unspecified: Secondary | ICD-10-CM | POA: Insufficient documentation

## 2017-03-26 DIAGNOSIS — N760 Acute vaginitis: Secondary | ICD-10-CM | POA: Diagnosis not present

## 2017-03-26 DIAGNOSIS — B9689 Other specified bacterial agents as the cause of diseases classified elsewhere: Secondary | ICD-10-CM | POA: Diagnosis not present

## 2017-03-26 LAB — POCT URINALYSIS DIPSTICK
BILIRUBIN UA: NEGATIVE
Blood, UA: NEGATIVE
GLUCOSE UA: NEGATIVE
LEUKOCYTES UA: NEGATIVE
Nitrite, UA: NEGATIVE
SPEC GRAV UA: 1.02 (ref 1.010–1.025)
Urobilinogen, UA: 0.2 E.U./dL
pH, UA: 7.5 (ref 5.0–8.0)

## 2017-03-26 LAB — POCT URINE PREGNANCY: Preg Test, Ur: NEGATIVE

## 2017-03-26 LAB — GLUCOSE, POCT (MANUAL RESULT ENTRY): POC Glucose: 101 mg/dl — AB (ref 70–99)

## 2017-03-26 LAB — POCT GLYCOSYLATED HEMOGLOBIN (HGB A1C): Hemoglobin A1C: 5.3

## 2017-03-26 MED ORDER — METRONIDAZOLE 500 MG PO TABS: 500.0000 mg | ORAL_TABLET | Freq: Two times a day (BID) | ORAL | 0 refills | Status: AC

## 2017-03-26 NOTE — Patient Instructions (Addendum)
Dysfunctional Uterine Bleeding Dysfunctional uterine bleeding is abnormal bleeding from the uterus. Dysfunctional uterine bleeding includes:  A period that comes earlier or later than usual.  A period that is lighter, heavier, or has blood clots.  Bleeding between periods.  Skipping one or more periods.  Bleeding after sexual intercourse.  Bleeding after menopause. Follow these instructions at home: Pay attention to any changes in your symptoms. Follow these instructions to help with your condition: Eating and drinking   Eat well-balanced meals. Include foods that are high in iron, such as liver, meat, shellfish, green leafy vegetables, and eggs.  If you become constipated:  Drink plenty of water.  Eat fruits and vegetables that are high in water and fiber, such as spinach, carrots, raspberries, apples, and mango. Medicines   Take over-the-counter and prescription medicines only as told by your health care provider.  Do not change medicines without talking with your health care provider.  Aspirin or medicines that contain aspirin may make the bleeding worse. Do not take those medicines:  During the week before your period.  During your period.  If you were prescribed iron pills, take them as told by your health care provider. Iron pills help to replace iron that your body loses because of this condition. Activity   If you need to change your sanitary pad or tampon more than one time every 2 hours:  Lie in bed with your feet raised (elevated).  Place a cold pack on your lower abdomen.  Rest as much as possible until the bleeding stops or slows down.  Do not try to lose weight until the bleeding has stopped and your blood iron level is back to normal. Other Instructions   For two months, write down:  When your period starts.  When your period ends.  When any abnormal bleeding occurs.  What problems you notice.  Keep all follow up visits as told by your  health care provider. This is important. Contact a health care provider if:  You get light-headed or weak.  You have nausea and vomiting.  You cannot eat or drink without vomiting.  You feel dizzy or have diarrhea while you are taking medicines.  You are taking birth control pills or hormones, and you want to change them or stop taking them. Get help right away if:  You develop a fever or chills.  You need to change your sanitary pad or tampon more than one time per hour.  Your bleeding becomes heavier, or your flow contains clots more often.  You develop pain in your abdomen.  You lose consciousness.  You develop a rash. This information is not intended to replace advice given to you by your health care provider. Make sure you discuss any questions you have with your health care provider. Document Released: 10/19/2000 Document Revised: 03/29/2016 Document Reviewed: 01/17/2015 Elsevier Interactive Patient Education  2017 Elsevier Inc.  Bacterial Vaginosis Bacterial vaginosis is a vaginal infection that occurs when the normal balance of bacteria in the vagina is disrupted. It results from an overgrowth of certain bacteria. This is the most common vaginal infection among women ages 4515-44. Because bacterial vaginosis increases your risk for STIs (sexually transmitted infections), getting treated can help reduce your risk for chlamydia, gonorrhea, herpes, and HIV (human immunodeficiency virus). Treatment is also important for preventing complications in pregnant women, because this condition can cause an early (premature) delivery. What are the causes? This condition is caused by an increase in harmful bacteria that are normally  present in small amounts in the vagina. However, the reason that the condition develops is not fully understood. What increases the risk? The following factors may make you more likely to develop this condition:  Having a new sexual partner or multiple  sexual partners.  Having unprotected sex.  Douching.  Having an intrauterine device (IUD).  Smoking.  Drug and alcohol abuse.  Taking certain antibiotic medicines.  Being pregnant. You cannot get bacterial vaginosis from toilet seats, bedding, swimming pools, or contact with objects around you. What are the signs or symptoms? Symptoms of this condition include:  Grey or white vaginal discharge. The discharge can also be watery or foamy.  A fish-like odor with discharge, especially after sexual intercourse or during menstruation.  Itching in and around the vagina.  Burning or pain with urination. Some women with bacterial vaginosis have no signs or symptoms. How is this diagnosed? This condition is diagnosed based on:  Your medical history.  A physical exam of the vagina.  Testing a sample of vaginal fluid under a microscope to look for a large amount of bad bacteria or abnormal cells. Your health care provider may use a cotton swab or a small wooden spatula to collect the sample. How is this treated? This condition is treated with antibiotics. These may be given as a pill, a vaginal cream, or a medicine that is put into the vagina (suppository). If the condition comes back after treatment, a second round of antibiotics may be needed. Follow these instructions at home: Medicines   Take over-the-counter and prescription medicines only as told by your health care provider.  Take or use your antibiotic as told by your health care provider. Do not stop taking or using the antibiotic even if you start to feel better. General instructions   If you have a female sexual partner, tell her that you have a vaginal infection. She should see her health care provider and be treated if she has symptoms. If you have a female sexual partner, he does not need treatment.  During treatment:  Avoid sexual activity until you finish treatment.  Do not douche.  Avoid alcohol as directed by  your health care provider.  Avoid breastfeeding as directed by your health care provider.  Drink enough water and fluids to keep your urine clear or pale yellow.  Keep the area around your vagina and rectum clean.  Wash the area daily with warm water.  Wipe yourself from front to back after using the toilet.  Keep all follow-up visits as told by your health care provider. This is important. How is this prevented?  Do not douche.  Wash the outside of your vagina with warm water only.  Use protection when having sex. This includes latex condoms and dental dams.  Limit how many sexual partners you have. To help prevent bacterial vaginosis, it is best to have sex with just one partner (monogamous).  Make sure you and your sexual partner are tested for STIs.  Wear cotton or cotton-lined underwear.  Avoid wearing tight pants and pantyhose, especially during summer.  Limit the amount of alcohol that you drink.  Do not use any products that contain nicotine or tobacco, such as cigarettes and e-cigarettes. If you need help quitting, ask your health care provider.  Do not use illegal drugs. Where to find more information:  Centers for Disease Control and Prevention: SolutionApps.co.za  American Sexual Health Association (ASHA): www.ashastd.org  [place]. Department of Health and [employer], Office on [employer]  Health: ConventionalMedicines.si or http://www.anderson-williamson.info/ Contact a health care provider if:  Your symptoms do not improve, even after treatment.  You have more discharge or pain when urinating.  You have a fever.  You have pain in your abdomen.  You have pain during sex.  You have vaginal bleeding between periods. Summary  Bacterial vaginosis is a vaginal infection that occurs when the normal balance of bacteria in the vagina is disrupted.  Because bacterial vaginosis increases your risk for STIs (sexually transmitted infections),  getting treated can help reduce your risk for chlamydia, gonorrhea, herpes, and HIV (human immunodeficiency virus). Treatment is also important for preventing complications in pregnant women, because the condition can cause an early (premature) delivery.  This condition is treated with antibiotic medicines. These may be given as a pill, a vaginal cream, or a medicine that is put into the vagina (suppository). This information is not intended to replace advice given to you by your health care provider. Make sure you discuss any questions you have with your health care provider. Document Released: 10/22/2005 Document Revised: 07/07/2016 Document Reviewed: 07/07/2016 Elsevier Interactive Patient Education  2017 [employer].

## 2017-03-26 NOTE — Progress Notes (Addendum)
Subjective:  Patient ID: Donna Stark, female    DOB: 04/07/1976  Age: 41 y.o. MRN: [id number]  CC: Check BTL status, menstrual issues, STD check  HPI Donna Stark is a 41 y.o. female with a PMH of BTL presents with concern of BTL failure. Heard something pop on right side after BTL and believes that perhaps her BTL failed. Says she looked up information online that perhaps indicates her BTL failed. Has had unprotected sex ever since the "pop" and has not become pregnant. Still thinks that perhaps there is a chance BTL failed. She also complains of menstrual irregularity. Has amenorrhea since 3 months ago. Lastly, thinks she has bacterial vaginosis again. Says she is prone to BV. Sexually active with one man. Thinks there is low risk for infidelity. Pt does not engage in high risk sexual practices.     ROS Review of Systems  Constitutional: Negative for chills, fever and malaise/fatigue.  Eyes: Negative for blurred vision.  Respiratory: Negative for shortness of breath.   Cardiovascular: Negative for chest pain and palpitations.  Gastrointestinal: Negative for abdominal pain and nausea.  Genitourinary: Negative for dysuria and hematuria.  Musculoskeletal: Negative for joint pain and myalgias.  Skin: Negative for rash.  Neurological: Negative for tingling and headaches.  Psychiatric/Behavioral: Negative for depression. The patient is not nervous/anxious.     Objective:  BP 105/70 (BP Location: Right Arm, Patient Position: Sitting, Cuff Size: Normal)   Pulse 72   Temp 97.9 F (36.6 C) (Oral)   Wt 159 lb 3.2 oz (72.2 kg)   SpO2 100%   BMI 24.93 kg/m   BP/Weight 03/26/2017 03/08/2017 02/12/2017  Systolic BP 105 115 108  Diastolic BP 70 62 70  Wt. (Lbs) 159.2 - -  BMI 24.93 - 24.75      Physical Exam  Constitutional: She is oriented to person, place, and time.  Well developed, well nourished, NAD, polite  HENT:  Head: Normocephalic and atraumatic.  Eyes: No scleral icterus.   Neck: Normal range of motion. Neck supple. No thyromegaly present.  Cardiovascular: Normal rate, regular rhythm and normal heart sounds.   Pulmonary/Chest: Effort normal and breath sounds normal.  Abdominal: Soft. Bowel sounds are normal. There is no tenderness.  Genitourinary:  Genitourinary Comments: Thick grayish white discharge with fish odor.  Musculoskeletal: She exhibits no edema.  Neurological: She is alert and oriented to person, place, and time.  Skin: Skin is warm and dry. No rash noted. No erythema. No pallor.  Psychiatric: She has a normal mood and affect. Her behavior is normal. Thought content normal.  Vitals reviewed.    Assessment & Plan:   1. History of bilateral tubal ligation - DG Hysterogram (HSG); Future. Per patient request.  - POCT urine pregnancy negative  2. BV (bacterial vaginosis) - Begin metroNIDAZOLE (FLAGYL) 500 MG tablet; Take 1 tablet (500 mg total) by mouth 2 (two) times daily.  Dispense: 14 tablet; Refill: 0  3. Screening examination for STD (sexually transmitted disease) - HIV antibody - RPR - Urine cytology ancillary only  4. Menstrual irregularity - TSH - FSH/LH  5. Urinary frequency - Urinalysis Dipstick negative in clinic today  6. Nausea and vomiting, intractability of vomiting not specified, unspecified vomiting type - Glucose (CBG) 101 in clinic today. - HgB A1c 5.3%   7. Polydipsia - Glucose (CBG) 101 in clinic today. - HgB A1c 5.3%   Meds ordered this encounter  Medications  . metroNIDAZOLE (FLAGYL) 500 MG tablet    Sig: Take 1  tablet (500 mg total) by mouth 2 (two) times daily.    Dispense:  14 tablet    Refill:  0    Order Specific Question:   Supervising Provider    Answer:   Quentin Angst [[id number]]    Follow-up: Return if symptoms worsen or fail to improve, for full physical.   Loletta Specter PA

## 2017-03-27 LAB — TSH: TSH: 0.859 u[IU]/mL (ref 0.450–4.500)

## 2017-03-27 LAB — FSH/LH
FSH: 84.3 m[IU]/mL
LH: 50.8 m[IU]/mL

## 2017-03-27 LAB — RPR: RPR: NONREACTIVE

## 2017-03-27 LAB — HIV ANTIBODY (ROUTINE TESTING W REFLEX): HIV Screen 4th Generation wRfx: NONREACTIVE

## 2017-03-28 ENCOUNTER — Other Ambulatory Visit (INDEPENDENT_AMBULATORY_CARE_PROVIDER_SITE_OTHER): Payer: Self-pay | Admitting: Physician Assistant

## 2017-03-28 DIAGNOSIS — E349 Endocrine disorder, unspecified: Secondary | ICD-10-CM

## 2017-03-28 NOTE — Progress Notes (Signed)
Elevated FSH and LH for her age. 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

## 2017-04-02 LAB — URINE CYTOLOGY ANCILLARY ONLY
Chlamydia: NEGATIVE
NEISSERIA GONORRHEA: NEGATIVE
TRICH (WINDOWPATH): NEGATIVE

## 2017-04-03 ENCOUNTER — Ambulatory Visit (HOSPITAL_COMMUNITY): Admission: RE | Admit: 2017-04-03 | Payer: Medicaid Other | Source: Ambulatory Visit

## 2017-04-03 ENCOUNTER — Telehealth (INDEPENDENT_AMBULATORY_CARE_PROVIDER_SITE_OTHER): Payer: Self-pay | Admitting: *Deleted

## 2017-04-03 LAB — URINE CYTOLOGY ANCILLARY ONLY: Candida vaginitis: NEGATIVE

## 2017-04-03 NOTE — Telephone Encounter (Signed)
Patient verified DOB Patient is aware of labs and STI being negative/normal. Patient is aware of premature ovarian failure being noted and [facility] not accepting medicaid at this time. MA will inform referral personnel and have referral sent to an accepting workque

## 2017-04-03 NOTE — Telephone Encounter (Signed)
-----   Message from Loletta Specteroger David Gomez, PA-C sent at 04/03/2017  8:20 AM EDT ----- Negative chlamydia, gonorrhea, and trichomonas.

## 2017-04-05 ENCOUNTER — Other Ambulatory Visit (INDEPENDENT_AMBULATORY_CARE_PROVIDER_SITE_OTHER): Payer: Self-pay | Admitting: Physician Assistant

## 2017-04-05 DIAGNOSIS — B379 Candidiasis, unspecified: Secondary | ICD-10-CM

## 2017-04-05 MED ORDER — FLUCONAZOLE 150 MG PO TABS: 150.0000 mg | ORAL_TABLET | Freq: Once | ORAL | 0 refills | Status: AC

## 2017-04-05 NOTE — Progress Notes (Signed)
Pt requests medication for yeast infection.

## 2017-06-06 ENCOUNTER — Encounter (HOSPITAL_COMMUNITY): Payer: Self-pay | Admitting: Emergency Medicine

## 2017-06-06 ENCOUNTER — Emergency Department (HOSPITAL_COMMUNITY)
Admission: EM | Admit: 2017-06-06 | Discharge: 2017-06-06 | Disposition: A | Discharge status: Home / Self Care | Payer: Medicaid Other | Attending: Pediatric Emergency Medicine | Admitting: Pediatric Emergency Medicine

## 2017-06-06 DIAGNOSIS — Z880 Allergy status to penicillin: Secondary | ICD-10-CM | POA: Insufficient documentation

## 2017-06-06 DIAGNOSIS — R21 Rash and other nonspecific skin eruption: Secondary | ICD-10-CM | POA: Insufficient documentation

## 2017-06-06 MED ORDER — DIPHENHYDRAMINE HCL 25 MG PO TABS: 25.0000 mg | ORAL_TABLET | Freq: Four times a day (QID) | ORAL | 0 refills | Status: DC | PRN

## 2017-06-06 NOTE — ED Triage Notes (Signed)
Pt arrives with c/o generalized head to toe itching, denies visible rash, for about the last 2 days. [facility] to [facility]. sts used some of sons cream he was given. Denies fevers, n,v,d. Denies pain

## 2017-06-06 NOTE — ED Provider Notes (Signed)
[facility]-EMERGENCY DEPT Provider Note   CSN: [id number] Arrival date & time: 06/06/17  2039     History   Chief Complaint Chief Complaint  Patient presents with  . Rash    HPI Donna Stark is a 41 y.o. female.  HPI   41 year old female came in along with her 2 children with complaint of itchiness.  Pt report for the past 2 days she has had generalized itchiness throughout body.  No report of visible rash, headache, fever, n/v/d, abd pain, trouble breathing.  [facility] and was diagnosed with scabies.  Pt have been using her son's cream which was prescribed but without relief.    History reviewed. No pertinent past medical history.  There are no active problems to display for this patient.   Past Surgical History:  Procedure Laterality Date  . CESAREAN SECTION      OB History    No data available       Home Medications    Prior to Admission medications   Medication Sig Start Date End Date Taking? Authorizing Provider  lidocaine (XYLOCAINE) 2 % solution Take 10 ml po q 4 hours prn Patient not taking: Reported on 03/26/2017 03/08/17   Deatra Canterxford, William J, FNP    Family History No family history on file.  Social History Social History  Substance Use Topics  . Smoking status: Never Smoker  . Smokeless tobacco: Never Used  . Alcohol use No     Allergies   Sulfur and Penicillins   Review of Systems Review of Systems  All other systems reviewed and are negative.    Physical Exam Updated Vital Signs BP 99/75 (BP Location: Right Arm)   Pulse 73   Temp 98.2 F (36.8 C) (Temporal)   Resp 18   Wt 71.5 kg (157 lb 10.1 oz)   SpO2 100%   BMI 24.69 kg/m   Physical Exam  Constitutional: She appears well-developed and well-nourished. No distress.  HENT:  Head: Atraumatic.  Mouth/Throat: Oropharynx is clear and moist.  Eyes: Conjunctivae are normal.  Neck: Neck supple.  Cardiovascular: Normal rate and regular rhythm.     Pulmonary/Chest: Effort normal and breath sounds normal.  Abdominal: Soft. There is no tenderness.  Neurological: She is alert.  Skin: No rash noted.  Several excoriating marks noted to upper back without concerning rash or infection.   Psychiatric: She has a normal mood and affect.  Nursing note and vitals reviewed.    ED Treatments / Results  Labs (all labs ordered are listed, but only abnormal results are displayed) Labs Reviewed - No data to display  EKG  EKG Interpretation None       Radiology No results found.  Procedures Procedures (including critical care time)  Medications Ordered in ED Medications - No data to display   Initial Impression / Assessment and Plan / ED Course  I have reviewed the triage vital signs and the nursing notes.  Pertinent labs & imaging results that were available during my care of the patient were reviewed by me and considered in my medical decision making (see chart for details).     BP 99/75 (BP Location: Right Arm)   Pulse 73   Temp 98.2 F (36.8 C) (Temporal)   Resp 18   Wt 71.5 kg (157 lb 10.1 oz)   SpO2 100%   BMI 24.69 kg/m    Final Clinical Impressions(s) / ED Diagnoses   Final diagnoses:  Rash    New Prescriptions Current Discharge Medication List     10:44 PM Pt's son had an itchy rash x 1 week.  Pt started having itchiness throughout body for the past few days.  No rash, just scratch marks.  No concerning feature.  Recommend trim nails, take benadryl as needed for itch. Reassurance given.     Fayrene Helperran, Thelmer Legler, PA-C 06/06/17 2245    Sharene SkeansBaab, Shad, MD 06/07/17 Lyda [id number]

## 2017-06-19 ENCOUNTER — Ambulatory Visit: Payer: Medicaid Other | Admitting: Obstetrics

## 2017-07-09 ENCOUNTER — Encounter: Payer: Self-pay | Admitting: Obstetrics & Gynecology

## 2017-07-09 ENCOUNTER — Other Ambulatory Visit: Payer: Self-pay | Admitting: Obstetrics & Gynecology

## 2017-07-09 ENCOUNTER — Other Ambulatory Visit (HOSPITAL_COMMUNITY)
Admission: RE | Admit: 2017-07-09 | Discharge: 2017-07-09 | Disposition: A | Discharge status: Home / Self Care | Payer: Medicaid Other | Source: Ambulatory Visit | Attending: Obstetrics & Gynecology | Admitting: Obstetrics & Gynecology

## 2017-07-09 ENCOUNTER — Ambulatory Visit (INDEPENDENT_AMBULATORY_CARE_PROVIDER_SITE_OTHER): Payer: Medicaid Other | Admitting: Obstetrics & Gynecology

## 2017-07-09 VITALS — BP 109/65 | HR 65 | Ht 67.0 in | Wt 153.8 lb

## 2017-07-09 DIAGNOSIS — N898 Other specified noninflammatory disorders of vagina: Secondary | ICD-10-CM | POA: Insufficient documentation

## 2017-07-09 DIAGNOSIS — R1030 Lower abdominal pain, unspecified: Secondary | ICD-10-CM

## 2017-07-09 DIAGNOSIS — Z01419 Encounter for gynecological examination (general) (routine) without abnormal findings: Secondary | ICD-10-CM | POA: Diagnosis not present

## 2017-07-09 DIAGNOSIS — R1084 Generalized abdominal pain: Secondary | ICD-10-CM | POA: Insufficient documentation

## 2017-07-09 DIAGNOSIS — Z113 Encounter for screening for infections with a predominantly sexual mode of transmission: Secondary | ICD-10-CM | POA: Diagnosis present

## 2017-07-09 DIAGNOSIS — Z1231 Encounter for screening mammogram for malignant neoplasm of breast: Secondary | ICD-10-CM

## 2017-07-09 LAB — POCT URINALYSIS DIPSTICK
BILIRUBIN UA: NEGATIVE
GLUCOSE UA: NEGATIVE
Ketones, UA: NEGATIVE
LEUKOCYTES UA: NEGATIVE
NITRITE UA: NEGATIVE
PH UA: 7 (ref 5.0–8.0)
Protein, UA: NEGATIVE
Spec Grav, UA: 1.01 (ref 1.010–1.025)
Urobilinogen, UA: 0.2 E.U./dL

## 2017-07-09 NOTE — Progress Notes (Signed)
Cc:Pt c/o yellow discharge and outer vaginal irritation x 2 wks. Pt also c/o intermittent abdominal bloating and pain x years per pt. Pt had menses Feb 2018 but didn't get another one until Aug. 2018.

## 2017-07-09 NOTE — Progress Notes (Signed)
Patient ID: Donna Stark, female   DOB: December 12, 1975, 41 y.o.   MRN: [id number]  Chief Complaint  Patient presents with  . Gynecologic Exam    HPI Roland Prine is a 41 y.o. female.  [id number] Patient's last menstrual period was 07/08/2017. She has has irregular menses and some VMS in the last year, Patient's last menstrual period was 07/08/2017. She noted bloating and pelvic discomfort HPI  History reviewed. No pertinent past medical history.  Past Surgical History:  Procedure Laterality Date  . CERVICAL BIOPSY  W/ LOOP ELECTRODE EXCISION  2013  . CESAREAN SECTION    2011 and 2016, BTL  Family History  Problem Relation Age of Onset  . COPD Mother   . Hypertension Mother   . Diabetes Mother   . Arthritis Mother   . Arthritis Sister   . Lung cancer Maternal Grandmother   . Diabetes Maternal Grandmother     Social History Social History  Substance Use Topics  . Smoking status: Never Smoker  . Smokeless tobacco: Never Used  . Alcohol use Yes     Comment: occ. wine     Allergies  Allergen Reactions  . Sulfur   . Penicillins Rash    Has patient had a PCN reaction causing immediate rash, facial/tongue/throat swelling, SOB or lightheadedness with hypotension: Yes Has patient had a PCN reaction causing severe rash involving mucus membranes or skin necrosis: Yes Has patient had a PCN reaction that required hospitalization: No Has patient had a PCN reaction occurring within the last 10 years: No If all of the above answers are "NO", then may proceed with Cephalosporin use.     No current outpatient prescriptions on file.   No current facility-administered medications for this visit.     Review of Systems Review of Systems  Constitutional: Negative.   Gastrointestinal: Positive for abdominal distention (bloating).  Endocrine:       VMS  Genitourinary: Positive for frequency (resolved), menstrual problem (no menses 12/2016-06/2017), pelvic pain (mild) and vaginal bleeding.     Blood pressure 109/65, pulse 65, height 5\' 7"  (1.702 m), weight 153 lb 12.8 oz (69.8 kg), last menstrual period 07/08/2017.  Physical Exam Physical Exam  Constitutional: She is oriented to person, place, and time. She appears well-developed. No distress.  Cardiovascular: Normal rate.   Pulmonary/Chest: Effort normal. No respiratory distress.  Breasts: breasts appear normal, no suspicious masses, no skin or nipple changes or axillary nodes.   Abdominal: Soft. She exhibits no distension. There is no tenderness.  Transverse scar well healed  Genitourinary: No vaginal discharge found.  Genitourinary Comments: Pelvic exam: normal external genitalia, vulva, vagina, cervix, uterus and adnexa.   Neurological: She is alert and oriented to person, place, and time.  Skin: Skin is warm and dry.  Psychiatric: She has a normal mood and affect. Her behavior is normal.  Vitals reviewed.   Data Reviewed IGP CTNGTV Aptima HPV RFX16/1812/15/2015 [facility] Component Name Value Ref Range  Gyn Interpretation Comment  Comment: NEGATIVE FOR INTRAEPITHELIAL LESION AND MALIGNANCY.   Specimen Adequacy: Comment  Comment: Satisfactory for evaluation. Endocervical and/or squamous metaplastic cells (endocervical component) are present.   Clinician provided ICD10 Comment  Comment: Z34.81 Z01.419 Z11.51   Performed By: Comment  Comment: Tiffany Vercher, Cytotechnologist (ASCP)   Microscopic Observation .   Gyn Note Comment  Comment: The Pap smear is a screening test designed to aid in the detection of premalignant and malignant conditions of the uterine cervix.It is not a diagnostic procedure and  should not be used as the sole means of detecting cervical cancer.Both false-positive and false-negative reports do occur.   Test Methodology Comment  Comment: This liquid based ThinPrep(R) pap test was screened with the use of an image guided system.   HPV Aptima Negative   Comment: This test detects fourteen high-risk HPV types (16/18/31/33/35/39/45/ 51/52/56/58/59/66/68) without differentiation. Negative   CHLAMYDIA, NUC. ACID AMP Negative Negative   GONOCOCCUS, NUC. ACID AMP Negative Negative   Trich Vag by NAA Negative Negative   Specimen  Genital - Cervix  Result Narrative  Specimen Comment: Source............Marland Kitchen.Cervical;Endocervical Specimen Comment: No. of containers.Marland Kitchen.01 CYTYC Thin Prep Vial Performed at:[facility] [employer] [address], [place], [id number] Lab Director: Mila HomerWilliam F Hancock MD, Phone:[phone] Performed at:02 [facility] [address], [place], [id number] Lab Director: Mila HomerWilliam F Hancock MD, Phone:[phone]     Assessment    Well woman exam Possible perimenopausal    Plan    [facility], STD screening Mammogram  F/U to review results       Scheryl DarterJames Arnold 07/09/2017, 4:26 PM

## 2017-07-09 NOTE — Patient Instructions (Signed)
Perimenopause Perimenopause is the time when your body begins to move into the menopause (no menstrual period for 12 straight months). It is a natural process. Perimenopause can begin 2-8 years before the menopause and usually lasts for 1 year after the menopause. During this time, your ovaries may or may not produce an egg. The ovaries vary in their production of estrogen and progesterone hormones each month. This can cause irregular menstrual periods, difficulty getting pregnant, vaginal bleeding between periods, and uncomfortable symptoms. What are the causes?  Irregular production of the ovarian hormones, estrogen and progesterone, and not ovulating every month. Other causes include:  Tumor of the pituitary gland in the brain.  Medical disease that affects the ovaries.  Radiation treatment.  Chemotherapy.  Unknown causes.  Heavy smoking and excessive alcohol intake can bring on perimenopause sooner. What are the signs or symptoms?  Hot flashes.  Night sweats.  Irregular menstrual periods.  Decreased sex drive.  Vaginal dryness.  Headaches.  Mood swings.  Depression.  Memory problems.  Irritability.  Tiredness.  Weight gain.  Trouble getting pregnant.  The beginning of losing bone cells (osteoporosis).  The beginning of hardening of the arteries (atherosclerosis). How is this diagnosed? Your health care provider will make a diagnosis by analyzing your age, menstrual history, and symptoms. He or she will do a physical exam and note any changes in your body, especially your female organs. Female hormone tests may or may not be helpful depending on the amount of female hormones you produce and when you produce them. However, other hormone tests may be helpful to rule out other problems. How is this treated? In some cases, no treatment is needed. The decision on whether treatment is necessary during the perimenopause should be made by you and your health care  provider based on how the symptoms are affecting you and your lifestyle. Various treatments are available, such as:  Treating individual symptoms with a specific medicine for that symptom.  Herbal medicines that can help specific symptoms.  Counseling.  Group therapy. Follow these instructions at home:  Keep track of your menstrual periods (when they occur, how heavy they are, how long between periods, and how long they last) as well as your symptoms and when they started.  Only take over-the-counter or prescription medicines as directed by your health care provider.  Sleep and rest.  Exercise.  Eat a diet that contains calcium (good for your bones) and soy (acts like the estrogen hormone).  Do not smoke.  Avoid alcoholic beverages.  Take vitamin supplements as recommended by your health care provider. Taking vitamin E may help in certain cases.  Take calcium and vitamin D supplements to help prevent bone loss.  Group therapy is sometimes helpful.  Acupuncture may help in some cases. Contact a health care provider if:  You have questions about any symptoms you are having.  You need a referral to a specialist (gynecologist, psychiatrist, or psychologist). Get help right away if:  You have vaginal bleeding.  Your period lasts longer than 8 days.  Your periods are recurring sooner than 21 days.  You have bleeding after intercourse.  You have severe depression.  You have pain when you urinate.  You have severe headaches.  You have vision problems. This information is not intended to replace advice given to you by your health care provider. Make sure you discuss any questions you have with your health care provider. Document Released: 11/29/2004 Document Revised: 03/29/2016 Document Reviewed: 05/21/2013 Elsevier Interactive   Patient Education  2017 Elsevier Inc.  

## 2017-07-10 LAB — HIV ANTIBODY (ROUTINE TESTING W REFLEX): HIV Screen 4th Generation wRfx: NONREACTIVE

## 2017-07-10 LAB — RPR: RPR Ser Ql: NONREACTIVE

## 2017-07-10 LAB — HEPATITIS C ANTIBODY: HEP C VIRUS AB: 0.1 {s_co_ratio} (ref 0.0–0.9)

## 2017-07-10 LAB — TSH: TSH: 0.93 u[IU]/mL (ref 0.450–4.500)

## 2017-07-10 LAB — HEPATITIS B SURFACE ANTIGEN: Hepatitis B Surface Ag: NEGATIVE

## 2017-07-10 LAB — FOLLICLE STIMULATING HORMONE: FSH: 53.8 m[IU]/mL

## 2017-07-11 LAB — CERVICOVAGINAL ANCILLARY ONLY
Bacterial vaginitis: NEGATIVE
Candida vaginitis: NEGATIVE
Chlamydia: NEGATIVE
Neisseria Gonorrhea: NEGATIVE
TRICH (WINDOWPATH): NEGATIVE

## 2017-07-15 LAB — CYTOLOGY - PAP
Diagnosis: NEGATIVE
HPV 16/18/45 GENOTYPING: NEGATIVE
HPV: DETECTED — AB

## 2017-07-23 ENCOUNTER — Ambulatory Visit: Payer: Medicaid Other

## 2017-07-29 ENCOUNTER — Encounter (INDEPENDENT_AMBULATORY_CARE_PROVIDER_SITE_OTHER): Payer: Self-pay | Admitting: Physician Assistant

## 2017-07-29 ENCOUNTER — Ambulatory Visit (INDEPENDENT_AMBULATORY_CARE_PROVIDER_SITE_OTHER): Payer: Medicaid Other | Admitting: Physician Assistant

## 2017-07-29 VITALS — BP 102/66 | HR 59 | Temp 98.1°F | Wt 156.2 lb

## 2017-07-29 DIAGNOSIS — B351 Tinea unguium: Secondary | ICD-10-CM

## 2017-07-29 DIAGNOSIS — Z79899 Other long term (current) drug therapy: Secondary | ICD-10-CM | POA: Diagnosis not present

## 2017-07-29 DIAGNOSIS — N898 Other specified noninflammatory disorders of vagina: Secondary | ICD-10-CM

## 2017-07-29 DIAGNOSIS — R350 Frequency of micturition: Secondary | ICD-10-CM

## 2017-07-29 LAB — POCT URINALYSIS DIPSTICK
Bilirubin, UA: NEGATIVE
Blood, UA: NEGATIVE
GLUCOSE UA: NEGATIVE
Ketones, UA: NEGATIVE
Leukocytes, UA: NEGATIVE
Nitrite, UA: NEGATIVE
PH UA: 5 (ref 5.0–8.0)
PROTEIN UA: NEGATIVE
Spec Grav, UA: 1.015 (ref 1.010–1.025)
Urobilinogen, UA: 0.2 E.U./dL

## 2017-07-29 MED ORDER — METRONIDAZOLE 500 MG PO TABS: 500.0000 mg | ORAL_TABLET | Freq: Two times a day (BID) | ORAL | 0 refills | Status: AC

## 2017-07-29 MED ORDER — TERBINAFINE HCL 250 MG PO TABS: 250.0000 mg | ORAL_TABLET | Freq: Every day | ORAL | 0 refills | Status: DC

## 2017-07-29 MED ORDER — FLUCONAZOLE 150 MG PO TABS: 150.0000 mg | ORAL_TABLET | ORAL | 0 refills | Status: DC

## 2017-07-29 NOTE — Patient Instructions (Signed)
Bacterial Vaginosis Bacterial vaginosis is a vaginal infection that occurs when the normal balance of bacteria in the vagina is disrupted. It results from an overgrowth of certain bacteria. This is the most common vaginal infection among women ages 15-44. Because bacterial vaginosis increases your risk for STIs (sexually transmitted infections), getting treated can help reduce your risk for chlamydia, gonorrhea, herpes, and HIV (human immunodeficiency virus). Treatment is also important for preventing complications in pregnant women, because this condition can cause an early (premature) delivery. What are the causes? This condition is caused by an increase in harmful bacteria that are normally present in small amounts in the vagina. However, the reason that the condition develops is not fully understood. What increases the risk? The following factors may make you more likely to develop this condition:  Having a new sexual partner or multiple sexual partners.  Having unprotected sex.  Douching.  Having an intrauterine device (IUD).  Smoking.  Drug and alcohol abuse.  Taking certain antibiotic medicines.  Being pregnant.  You cannot get bacterial vaginosis from toilet seats, bedding, swimming pools, or contact with objects around you. What are the signs or symptoms? Symptoms of this condition include:  Grey or white vaginal discharge. The discharge can also be watery or foamy.  A fish-like odor with discharge, especially after sexual intercourse or during menstruation.  Itching in and around the vagina.  Burning or pain with urination.  Some women with bacterial vaginosis have no signs or symptoms. How is this diagnosed? This condition is diagnosed based on:  Your medical history.  A physical exam of the vagina.  Testing a sample of vaginal fluid under a microscope to look for a large amount of bad bacteria or abnormal cells. Your health care provider may use a cotton swab  or a small wooden spatula to collect the sample.  How is this treated? This condition is treated with antibiotics. These may be given as a pill, a vaginal cream, or a medicine that is put into the vagina (suppository). If the condition comes back after treatment, a second round of antibiotics may be needed. Follow these instructions at home: Medicines  Take over-the-counter and prescription medicines only as told by your health care provider.  Take or use your antibiotic as told by your health care provider. Do not stop taking or using the antibiotic even if you start to feel better. General instructions  If you have a female sexual partner, tell her that you have a vaginal infection. She should see her health care provider and be treated if she has symptoms. If you have a female sexual partner, he does not need treatment.  During treatment: ? Avoid sexual activity until you finish treatment. ? Do not douche. ? Avoid alcohol as directed by your health care provider. ? Avoid breastfeeding as directed by your health care provider.  Drink enough water and fluids to keep your urine clear or pale yellow.  Keep the area around your vagina and rectum clean. ? Wash the area daily with warm water. ? Wipe yourself from front to back after using the toilet.  Keep all follow-up visits as told by your health care provider. This is important. How is this prevented?  Do not douche.  Wash the outside of your vagina with warm water only.  Use protection when having sex. This includes latex condoms and dental dams.  Limit how many sexual partners you have. To help prevent bacterial vaginosis, it is best to have sex with just   one partner (monogamous).  Make sure you and your sexual partner are tested for STIs.  Wear cotton or cotton-lined underwear.  Avoid wearing tight pants and pantyhose, especially during summer.  Limit the amount of alcohol that you drink.  Do not use any products that  contain nicotine or tobacco, such as cigarettes and e-cigarettes. If you need help quitting, ask your health care provider.  Do not use illegal drugs. Where to find more information:  Centers for Disease Control and Prevention: www.cdc.gov/std  American Sexual Health Association (ASHA): www.ashastd.org  U.S. Department of Health and Human Services, Office on Women's Health: www.womenshealth.gov/ or https://www.womenshealth.gov/a-z-topics/bacterial-vaginosis Contact a health care provider if:  Your symptoms do not improve, even after treatment.  You have more discharge or pain when urinating.  You have a fever.  You have pain in your abdomen.  You have pain during sex.  You have vaginal bleeding between periods. Summary  Bacterial vaginosis is a vaginal infection that occurs when the normal balance of bacteria in the vagina is disrupted.  Because bacterial vaginosis increases your risk for STIs (sexually transmitted infections), getting treated can help reduce your risk for chlamydia, gonorrhea, herpes, and HIV (human immunodeficiency virus). Treatment is also important for preventing complications in pregnant women, because the condition can cause an early (premature) delivery.  This condition is treated with antibiotic medicines. These may be given as a pill, a vaginal cream, or a medicine that is put into the vagina (suppository). This information is not intended to replace advice given to you by your health care provider. Make sure you discuss any questions you have with your health care provider. Document Released: 10/22/2005 Document Revised: 07/07/2016 Document Reviewed: 07/07/2016 Elsevier Interactive Patient Education  2017 Elsevier Inc.  

## 2017-07-29 NOTE — Progress Notes (Signed)
Subjective:  Patient ID: Donna Stark, female    DOB: [birth year]  Age: 41 y.o. MRN: [id number]  CC: Nail problem, BV, possible UTI  HPI Donna Stark is a 41 y.o. female with a medical history of BTL presents with two week history of vulvar and vaginal irritation, thick white discharge, urinary frequency, and urinary urgency. Denies dysuria, high risk sexual practices, concern for STD, or constitutional symptoms.    Also complains of yellow and dystrophic nails of the right index, right middle, and left index fingers. Thinks she may have a fungal infection attributed to the use of press on nails.     ROS Review of Systems  Constitutional: Negative for chills, fever and malaise/fatigue.  Eyes: Negative for blurred vision.  Respiratory: Negative for shortness of breath.   Cardiovascular: Negative for chest pain and palpitations.  Gastrointestinal: Negative for abdominal pain and nausea.  Genitourinary: Negative for dysuria and hematuria.       Vaginal and vulvar irritation  Musculoskeletal: Negative for joint pain and myalgias.  Skin: Negative for rash.       Fingers with fungus  Neurological: Negative for tingling and headaches.  Psychiatric/Behavioral: Negative for depression. The patient is not nervous/anxious.     Objective:  BP 102/66 (BP Location: Left Arm, Patient Position: Sitting, Cuff Size: Normal)   Pulse (!) 59   Temp 98.1 F (36.7 C) (Oral)   Wt 156 lb 3.2 oz (70.9 kg)   LMP 07/08/2017   SpO2 100%   BMI 24.46 kg/m   BP/Weight 07/29/2017 07/09/2017 06/06/2017  Systolic BP 102 109 107  Diastolic BP 66 65 55  Wt. (Lbs) 156.2 153.8 157.63  BMI 24.46 24.09 24.69      Physical Exam  Constitutional: She is oriented to person, place, and time.  Well developed, well nourished, NAD, polite  HENT:  Head: Normocephalic and atraumatic.  Eyes: No scleral icterus.  Neck: Normal range of motion.  Pulmonary/Chest: Effort normal.  Abdominal: Soft. Bowel sounds are normal.  There is tenderness (mild suprapubic TTP).  Genitourinary:  Genitourinary Comments: Mild left adnexal TTP  Musculoskeletal: She exhibits no edema.  Neurological: She is alert and oriented to person, place, and time. No cranial nerve deficit. Coordination normal.  Skin: Skin is warm and dry. No rash noted. No erythema. No pallor.  Left index, right index and right middle finger with yellowish and dystrophic nail.  Psychiatric: She has a normal mood and affect. Her behavior is normal. Thought content normal.  Vitals reviewed.    Assessment & Plan:     1. Vaginal irritation - Begin metroNIDAZOLE (FLAGYL) 500 MG tablet; Take 1 tablet (500 mg total) by mouth 2 (two) times daily.  Dispense: 14 tablet; Refill: 0 - Begin fluconazole (DIFLUCAN) 150 MG tablet; Take 1 tablet (150 mg total) by mouth once a week.  Dispense: 3 tablet; Refill: 0  2. Urinary frequency - Urinalysis Dipstick  3. Onychomycosis - Begin terbinafine (LAMISIL) 250 MG tablet after LFT results; Take 1 tablet (250 mg total) by mouth daily.  Dispense: 90 tablet; Refill: 0  4. High risk medication use - Hepatic Function Panel   Meds ordered this encounter  Medications  . metroNIDAZOLE (FLAGYL) 500 MG tablet    Sig: Take 1 tablet (500 mg total) by mouth 2 (two) times daily.    Dispense:  14 tablet    Refill:  0    Order Specific Question:   Supervising Provider    Answer:   Quentin Angst [  [id number]]  . fluconazole (DIFLUCAN) 150 MG tablet    Sig: Take 1 tablet (150 mg total) by mouth once a week.    Dispense:  3 tablet    Refill:  0    Order Specific Question:   Supervising Provider    Answer:   Quentin Angst [id number]  . terbinafine (LAMISIL) 250 MG tablet    Sig: Take 1 tablet (250 mg total) by mouth daily.    Dispense:  90 tablet    Refill:  0    Order Specific Question:   Supervising Provider    Answer:   Quentin Angst [id number]    Follow-up: Return if symptoms worsen or fail to improve.    Loletta Specter PA

## 2017-07-30 ENCOUNTER — Telehealth (INDEPENDENT_AMBULATORY_CARE_PROVIDER_SITE_OTHER): Payer: Self-pay

## 2017-07-30 ENCOUNTER — Telehealth: Payer: Self-pay

## 2017-07-30 ENCOUNTER — Ambulatory Visit: Payer: Medicaid Other

## 2017-07-30 LAB — HEPATIC FUNCTION PANEL
ALK PHOS: 63 IU/L (ref 39–117)
ALT: 13 IU/L (ref 0–32)
AST: 24 IU/L (ref 0–40)
Albumin: 4.5 g/dL (ref 3.5–5.5)
BILIRUBIN, DIRECT: 0.12 mg/dL (ref 0.00–0.40)
Bilirubin Total: 0.4 mg/dL (ref 0.0–1.2)
TOTAL PROTEIN: 7.5 g/dL (ref 6.0–8.5)

## 2017-07-30 NOTE — Telephone Encounter (Signed)
Patient aware of normal liver results. Donna Stark, CMA

## 2017-07-30 NOTE — Telephone Encounter (Signed)
Patient notified of results and the importance of getting her PAP done in 12 months.  She verbalized understanding.

## 2017-07-30 NOTE — Telephone Encounter (Signed)
-----   Message from Kathee Delton, RN sent at 07/29/2017  6:07 PM EDT -----   ----- Message ----- From: Adam Phenix, MD Sent: 07/26/2017  10:08 AM To: [facility]  Neg cytology pos HPV, repeat pap and cotesting in 12 months

## 2017-07-30 NOTE — Telephone Encounter (Signed)
-----   Message from Loletta Specter, PA-C sent at 07/30/2017  1:42 PM EDT ----- Liver results normal.

## 2017-08-05 ENCOUNTER — Ambulatory Visit: Payer: Medicaid Other

## 2017-08-07 ENCOUNTER — Encounter (HOSPITAL_COMMUNITY): Payer: Self-pay | Admitting: Emergency Medicine

## 2017-08-07 ENCOUNTER — Ambulatory Visit (HOSPITAL_COMMUNITY)
Admission: EM | Admit: 2017-08-07 | Discharge: 2017-08-07 | Disposition: A | Discharge status: Home / Self Care | Payer: Medicaid Other | Attending: Family Medicine | Admitting: Family Medicine

## 2017-08-07 DIAGNOSIS — T7840XA Allergy, unspecified, initial encounter: Secondary | ICD-10-CM

## 2017-08-07 DIAGNOSIS — B09 Unspecified viral infection characterized by skin and mucous membrane lesions: Secondary | ICD-10-CM | POA: Diagnosis not present

## 2017-08-07 MED ORDER — PENCICLOVIR 1 % EX CREA: 1.0000 "application " | TOPICAL_CREAM | CUTANEOUS | 0 refills | Status: DC

## 2017-08-07 NOTE — Discharge Instructions (Signed)
Apply the cream to the upper and lower lip every 2 hours while awake for 3 days. Stop taking the terbinafine oral and all the primary care doctor.

## 2017-08-07 NOTE — ED Provider Notes (Signed)
[facility]    CSN: [id number] Arrival date & time: 08/07/17  1539     History   Chief Complaint Chief Complaint  Patient presents with  . Allergic Reaction    HPI Donna Stark is a 41 y.o. female.   41 year old female being treated for nail fungus of the hands with terbinafine by her PCP. Recently she as developed dry mouth, burning of the eyes, arthralgias and tingling of the lips. She believes that this may be associated with medication she is taking.        History reviewed. No pertinent past medical history.  There are no active problems to display for this patient.   Past Surgical History:  Procedure Laterality Date  . CERVICAL BIOPSY  W/ LOOP ELECTRODE EXCISION  2013  . CESAREAN SECTION      OB History    Gravida Para Term Preterm AB Living   SAB TAB Ectopic Multiple Live Births   1       2       Home Medications    Prior to Admission medications   Medication Sig Start Date End Date Taking? Authorizing Provider  fluconazole (DIFLUCAN) 150 MG tablet Take 1 tablet (150 mg total) by mouth once a week. 07/29/17  Yes Loletta Specter, PA-C  terbinafine (LAMISIL) 250 MG tablet Take 1 tablet (250 mg total) by mouth daily. 07/29/17  Yes Loletta Specter, PA-C  penciclovir [facility]) 1 % cream Apply 1 application topically every 2 (two) hours. 08/07/17   Hayden Rasmussen, NP    Family History Family History  Problem Relation Age of Onset  . COPD Mother   . Hypertension Mother   . Diabetes Mother   . Arthritis Mother   . Arthritis Sister   . Lung cancer Maternal Grandmother   . Diabetes Maternal Grandmother     Social History Social History  Substance Use Topics  . Smoking status: Never Smoker  . Smokeless tobacco: Never Used  . Alcohol use Yes     Comment: occ. wine      Allergies   Sulfur; Terbinafine and related; and Penicillins   Review of Systems Review of Systems  Constitutional: Negative for activity change  and fever.  HENT:       As per history of present illness Nasal stuffiness. Lips tingling, burning  Eyes: Negative.   Respiratory: Negative.   Gastrointestinal: Negative.   Genitourinary: Negative.   Skin: Negative for rash.  Neurological: Negative.   All other systems reviewed and are negative.    Physical Exam Triage Vital Signs ED Triage Vitals  Enc Vitals Group     BP 08/07/17 1601 111/72     Pulse Rate 08/07/17 1601 68     Resp 08/07/17 1601 16     Temp 08/07/17 1601 98.8 F (37.1 C)     Temp Source 08/07/17 1601 Oral     SpO2 08/07/17 1601 100 %     Weight 08/07/17 1600 158 lb (71.7 kg)     Height --      Head Circumference --      Peak Flow --      Pain Score 08/07/17 1600 0     Pain Loc --      Pain Edu? --      Excl. in GC? --    No data found.   Updated Vital Signs BP 111/72   Pulse 68   Temp 98.8  F (37.1 C) (Oral)   Resp 16   Wt 158 lb (71.7 kg)   LMP 07/08/2017   SpO2 100%   BMI 24.75 kg/m   Visual Acuity Right Eye Distance:   Left Eye Distance:   Bilateral Distance:    Right Eye Near:   Left Eye Near:    Bilateral Near:     Physical Exam  Constitutional: She is oriented to person, place, and time. She appears well-developed and well-nourished. No distress.  HENT:  Head: Normocephalic and atraumatic.  Mouth/Throat: Oropharynx is clear and moist. No oropharyngeal exudate.  Under magnification the lips and in particular the lower lip with rose of tiny vesicles over erythematous areas.no visible sloughing of skin. No ulcerations. Oral mucosa is free of lesions.  Neck: Normal range of motion. Neck supple.  Cardiovascular: Normal rate.   Pulmonary/Chest: Effort normal.  Musculoskeletal: She exhibits no edema.  Neurological: She is alert and oriented to person, place, and time.  Skin: Skin is warm.  Nursing note and vitals reviewed.    UC Treatments / Results  Labs (all labs ordered are listed, but only abnormal results are  displayed) Labs Reviewed - No data to display  EKG  EKG Interpretation None       Radiology No results found.  Procedures Procedures (including critical care time)  Medications Ordered in UC Medications - No data to display   Initial Impression / Assessment and Plan / UC Course  I have reviewed the triage vital signs and the nursing notes.  Pertinent labs & imaging results that were available during my care of the patient were reviewed by me and considered in my medical decision making (see chart for details).   he lesions to the upper and lower lip appear as though they may be viral in etiology college. We will treat with antiviral cream.  Apply the cream to the upper and lower lip every 2 hours while awake for 3 days. Stop taking the terbinafine oral and all the primary care doctor.  Final Clinical Impressions(s) / UC Diagnoses   Final diagnoses:  Allergic reaction, initial encounter  Viral enanthem of mouth    New Prescriptions New Prescriptions   PENCICLOVIR (DENAVIR) 1 % CREAM    Apply 1 application topically every 2 (two) hours.     Controlled Substance Prescriptions [place] Controlled Substance Registry consulted? Not Applicable   Hayden Rasmussen, NP 08/07/17 1652

## 2017-08-07 NOTE — ED Triage Notes (Signed)
PT is taking terbinafine for fungal infection of nails. PT reports eye burning, nose burning, and mouth burning. PT reports her throat feels "weird". No airway involvement. PT reports dry cough as well. PT last took med yesterday.

## 2017-08-08 ENCOUNTER — Telehealth (INDEPENDENT_AMBULATORY_CARE_PROVIDER_SITE_OTHER): Payer: Self-pay | Admitting: Physician Assistant

## 2017-08-08 NOTE — Telephone Encounter (Signed)
Patient should stop the medication immediately. Patient should receive an OV if one available or report to the UC/ED

## 2017-08-08 NOTE — Telephone Encounter (Signed)
Pt is having side effects like Dry Cough,burn throat,Body aches and Abdominal Pain   from terbinafine (LAMISIL) 250 MG tablet . Please, call her Thank You

## 2017-08-08 NOTE — Telephone Encounter (Signed)
Patient aware of that she went to Urgent Care and they told her to contact her pcp .  Patient has an appointment tomorrow @ 11:15am Thank You

## 2017-08-09 ENCOUNTER — Ambulatory Visit (INDEPENDENT_AMBULATORY_CARE_PROVIDER_SITE_OTHER): Payer: Medicaid Other | Admitting: Physician Assistant

## 2017-08-09 ENCOUNTER — Encounter (INDEPENDENT_AMBULATORY_CARE_PROVIDER_SITE_OTHER): Payer: Self-pay | Admitting: Physician Assistant

## 2017-08-09 VITALS — BP 105/70 | HR 60 | Temp 98.7°F | Wt 153.6 lb

## 2017-08-09 DIAGNOSIS — T7840XA Allergy, unspecified, initial encounter: Secondary | ICD-10-CM | POA: Diagnosis not present

## 2017-08-09 MED ORDER — PREDNISONE 10 MG (21) PO TBPK: ORAL_TABLET | ORAL | 0 refills | Status: DC

## 2017-08-09 NOTE — Patient Instructions (Addendum)
Terbinafine tablets What is this medicine? TERBINAFINE (TER bin a feen) is an antifungal medicine. It is used to treat certain kinds of fungal or yeast infections. This medicine may be used for other purposes; ask your health care provider or pharmacist if you have questions. COMMON BRAND NAME(S): Lamisil, Terbinex What should I tell my health care provider before I take this medicine? They need to know if you have any of these conditions: -drink alcoholic beverages -kidney disease -liver disease -an unusual or allergic reaction to terbinafine, other medicines, foods, dyes, or preservatives -pregnant or trying to get pregnant -breast-feeding How should I use this medicine? Take this medicine by mouth with a full glass of water. Follow the directions on the prescription label. You can take this medicine with food or on an empty stomach. Take your medicine at regular intervals. Do not take your medicine more often than directed. Do not skip doses or stop your medicine early even if you feel better. Do not stop taking except on your doctor's advice. Talk to your pediatrician regarding the use of this medicine in children. Special care may be needed. Overdosage: If you think you have taken too much of this medicine contact a poison control center or emergency room at once. NOTE: This medicine is only for you. Do not share this medicine with others. What if I miss a dose? If you miss a dose, take it as soon as you can. If it is almost time for your next dose, take only that dose. Do not take double or extra doses. What may interact with this medicine? Do not take this medicine with any of the following medications: -thioridazine This medicine may also interact with the following medications: -beta-blockers -caffeine -cimetidine -cyclosporine -medicines for depression, anxiety, or psychotic disturbances -medicines for fungal infections like fluconazole and ketoconazole -medicines for irregular  heartbeat like amiodarone, flecainide and propafenone -rifampin -warfarin This list may not describe all possible interactions. Give your health care provider a list of all the medicines, herbs, non-prescription drugs, or dietary supplements you use. Also tell them if you smoke, drink alcohol, or use illegal drugs. Some items may interact with your medicine. What should I watch for while using this medicine? Visit your doctor or health care provider regularly. Tell your doctor right away if you have nausea or vomiting, loss of appetite, stomach pain on your right upper side, yellow skin, dark urine, light stools, or are over tired. Some fungal infections need many weeks or months of treatment to cure. If you are taking this medicine for a long time, you will need to have important blood work done. What side effects may I notice from receiving this medicine? Side effects that you should report to your doctor or health care professional as soon as possible: -allergic reactions like skin rash or hives, swelling of the face, lips, or tongue -changes in vision -dark urine -fever or infection -general ill feeling or flu-like symptoms -light-colored stools -loss of appetite, nausea -redness, blistering, peeling or loosening of the skin, including inside the mouth -right upper belly pain -unusually weak or tired -yellowing of the eyes or skin Side effects that usually do not require medical attention (report to your doctor or health care professional if they continue or are bothersome): -changes in taste -diarrhea -hair loss -muscle or joint pain -stomach gas -stomach upset This list may not describe all possible side effects. Call your doctor for medical advice about side effects. You may report side effects to FDA at   1-800-FDA-1088. Where should I keep my medicine? Keep out of the reach of children. Store at room temperature below 25 degrees C (77 degrees F). Protect from light. Throw away any  unused medicine after the expiration date. NOTE: This sheet is a summary. It may not cover all possible information. If you have questions about this medicine, talk to your doctor, pharmacist, or health care provider.  2018 Elsevier/Gold Standard (2008-01-02 16:28:07)  Anaphylactic Reaction, Adult An anaphylactic reaction (anaphylaxis) is a sudden, severe allergic reaction that affects multiple areas of the body. Affected areas of the body may include the skin, mouth, lungs, heart, or gut (digestive system). Anaphylaxis can be life-threatening. This condition requires immediate medical attention, and sometimes hospitalization. What are the causes? This condition is caused by exposure to a substance that you are allergic to (allergen). In response to this exposure, the body releases proteins (antibodies) and other compounds, such as histamine, into the bloodstream. This causes swelling in certain tissues and loss of blood pressure to important areas, such as the heart and lungs. Common allergens that can cause anaphylaxis include:  Medicines.  Foods, especially peanuts, wheat, shellfish, milk, and eggs.  Insect bites or stings.  Blood or parts of blood, including plasma, immunoglobulins, or serum.  Chemicals, such as dyes, latex, and contrast material that is used for medical tests.  What increases the risk? This condition is more likely to occur in people who:  Have allergies.  Have had anaphylaxis before.  Have a family history of anaphylaxis.  Have certain medical conditions, including asthma and eczema.  What are the signs or symptoms? Symptoms of anaphylaxis include:  Nasal congestion.  Headache.  Flushed face.  Tingling in the mouth.  An itchy, red rash.  Swelling of the eyes, lips, face, or tongue.  Swelling of the back of the mouth and the throat.  Wheezing.  A hoarse voice.  Itchy, red, swollen areas of skin (hives).  Dizziness or  light-headedness.  Fainting.  Anxiety or confusion.  Abdominal or chest pain.  Difficulty breathing, speaking, or swallowing.  Chest or throat tightness.  Fast or irregular heartbeats (palpitations).  Vomiting.  Diarrhea.  How is this diagnosed? This condition is diagnosed based on a physical exam and your history of recent exposure to allergens. You may be referred for follow-up testing by a health care provider who specializes in allergies. This testing can confirm the diagnosis and determine which substances you are allergic to. Testing may include:  Skin tests. These may involve: ? Injecting a small amount of the possible allergen between layers of your skin (intradermal injection). ? Applying patches to your skin.  Blood tests.  How is this treated? Emergency treatment may include:  Medicines that help: ? To relieve itching and hives (antihistamines). ? To reduce swelling (corticosteroids). ? To tighten your blood vessels and increase your heart rate (epinephrine).  Oxygen therapy to help you breathe.  Giving fluids through an IV tube.  Your health care provider may teach you how to use an anaphylaxis kit and how to give yourself an epinephrine injection with what is commonly called an auto-injector "pen" (pre-filled automatic epinephrine injection device). If you think that you are having an anaphylactic reaction, you should use an auto-injector pen or an anaphylaxis kit. If you use epinephrine, you must still seek emergency medical treatment. Follow these instructions at home: Safety  Always keep an auto-injector pen or an anaphylaxis kit near you. These can be lifesaving if you have a severe anaphylactic  reaction. Use your auto-injector pen or anaphylaxis kit as told by your health care provider.  Do not drive until your health care provider approves.  Make sure that you, the members of your household, and your employer know: ? How to use an anaphylaxis  kit. ? How to use an auto-injector pen to give you an epinephrine injection.  Replace your epinephrine immediately after you use your auto-injector pen, in case you have another reaction.  Wear a medical alert bracelet or necklace that states your allergy, if told by your health care provider.  Learn the signs of anaphylaxis.  Work with your health care providers to come up with an anaphylaxis plan. Preparation is important. General instructions  Take over-the-counter and prescription medicines only as told by your health care provider.  If you have hives or a rash: ? Use an over-the-counter antihistamine as told by your health care provider. ? Apply cold, wet cloths (cold compresses) to your skin or take baths or showers in cool water. Avoid hot water.  If you had tests done, it is your responsibility to get your test results. Ask your health care provider or the department performing the tests when your results will be ready.  Tell any health care providers who care for you that you have an allergy.  Keep all follow-up visits as told by your health care provider. This is important. How is this prevented?  Avoid allergens that have caused an anaphylactic reaction for you before.  When you are at a restaurant, tell your server that you have an allergy. If you are unsure whether a meal has an ingredient that you are allergic to, ask your server. Contact a health care provider if:  You develop symptoms of an allergic reaction. You may notice them soon after you are exposed to a substance. The symptoms may include: ? Rash. ? Headache. ? Sneezing or a runny nose. ? Swelling. ? Nausea. ? Diarrhea. Get help right away if:  You needed to use epinephrine. ? An epinephrine injection helps to manage life-threatening allergic reactions, but you still need to go to the emergency room even if epinephrine seems to work. This is important because anaphylaxis may happen again within 72 hours  (rebound anaphylaxis). ? If you used epinephrine to treat anaphylaxis outside of the hospital, you need additional medical care. This may include more doses of epinephrine.  You develop: ? A tight feeling in your chest or throat. ? Wheezing or difficulty breathing. ? Hives. ? Red skin or itching all over your body. ? Swelling in your lips, tongue, or the back of your throat.  You have severe vomiting or diarrhea.  You faint or you feel like you are going to faint. These symptoms may represent a serious problem that is an emergency. Do not wait to see if the symptoms will go away. Use your auto-injector pen or anaphylaxis kit as you have been instructed, and get medical help right away. Call your local emergency services (911 in the [place] Do not drive yourself to the hospital. This information is not intended to replace advice given to you by your health care provider. Make sure you discuss any questions you have with your health care provider. Document Released: 10/22/2005 Document Revised: 06/19/2016 Document Reviewed: 05/07/2016 Elsevier Interactive Patient Education  [employer].

## 2017-08-09 NOTE — Progress Notes (Signed)
PT STATES THAT AFTER TAKING TERBINAFINE HER LIPS DARKENED, BLISTER FORMED ON LIP, HAS A MEDICINE TASTE IN HER MOUTH AND CANT GET RID OF IT.  PT SAYS SHE BEGAN HAVING FLU LIKE SYMPTOMS

## 2017-08-09 NOTE — Progress Notes (Signed)
Subjective:  Patient ID: Donna Stark, female    DOB: [birth year]  Age: 41 y.o. MRN: [id number]  CC: medication reaction  HPI Donna Stark is a 41 y.o. female with a medical history of onychomycosis presents with allergic reaction to terbinafine. Went to Urgent Care two days ago with complaints of dry mouth, burning of the eyes, arthralgias and tingling of the lips. Was prescribed Penciclovir 1% cream and told to stop Terbinafine. Thinks she may have been short of breath at one point but does not feel short of breath now. Does not have all the same symptoms as before. Reports small blisters in her mouth, and larger blister on her bottom lip, strange taste in her mouth, thinks her lips have darkened, and arthralgias.       Outpatient Medications Prior to Visit  Medication Sig Dispense Refill  . fluconazole (DIFLUCAN) 150 MG tablet Take 1 tablet (150 mg total) by mouth once a week. 3 tablet 0  . penciclovir (DENAVIR) 1 % cream Apply 1 application topically every 2 (two) hours. 1.5 g 0  . terbinafine (LAMISIL) 250 MG tablet Take 1 tablet (250 mg total) by mouth daily. 90 tablet 0   No facility-administered medications prior to visit.      ROS Review of Systems  Constitutional: Negative for chills, fever and malaise/fatigue.  Eyes: Negative for blurred vision.  Respiratory: Positive for shortness of breath (one episode. Not currently.).   Cardiovascular: Negative for chest pain and palpitations.  Gastrointestinal: Negative for abdominal pain and nausea.  Genitourinary: Negative for dysuria and hematuria.  Musculoskeletal: Positive for joint pain. Negative for myalgias.  Skin: Positive for rash.       blisters  Neurological: Negative for tingling and headaches.  Psychiatric/Behavioral: Negative for depression. The patient is not nervous/anxious.     Objective:  BP 105/70 (BP Location: Right Arm, Patient Position: Sitting, Cuff Size: Normal)   Pulse 60   Temp 98.7 F (37.1 C)  (Oral)   Wt 153 lb 9.6 oz (69.7 kg)   SpO2 100%   BMI 24.06 kg/m   BP/Weight 08/09/2017 08/07/2017 07/29/2017  Systolic BP 105 111 102  Diastolic BP 70 72 66  Wt. (Lbs) 153.6 158 156.2  BMI 24.06 24.75 24.46      Physical Exam  Constitutional: She is oriented to person, place, and time.  Well developed, well nourished, NAD, polite  HENT:  Head: Normocephalic and atraumatic.  Small scabbed lesion on bottom lip  Eyes: Conjunctivae are normal. No scleral icterus.  Neck: Normal range of motion. Neck supple. No thyromegaly present.  Cardiovascular: Normal rate, regular rhythm and normal heart sounds.   Pulmonary/Chest: Effort normal and breath sounds normal. No respiratory distress. She has no wheezes. She has no rales.  Abdominal: Soft. Bowel sounds are normal. There is no tenderness.  Musculoskeletal: She exhibits no edema.  Lymphadenopathy:    She has no cervical adenopathy.  Neurological: She is alert and oriented to person, place, and time. No cranial nerve deficit. Coordination normal.  Skin: Skin is warm and dry. No rash noted. No erythema. No pallor.  Psychiatric: She has a normal mood and affect. Her behavior is normal. Thought content normal.  Vitals reviewed.    Assessment & Plan:    1. Allergic reaction to drug, initial encounter - Begin predniSONE (STERAPRED UNI-PAK 21 TAB) 10 MG (21) TBPK tablet; Take as directed on the packaging.  Dispense: 21 tablet; Refill: 0 - Stop Terbinafine - Read instructional material provided.  Meds ordered this encounter  Medications  . DISCONTD: predniSONE (STERAPRED UNI-PAK 21 TAB) 10 MG (21) TBPK tablet    Sig: Take as directed on the packaging.    Dispense:  21 tablet    Refill:  0    Order Specific Question:   Supervising Provider    Answer:   Quentin Angst [id number]  . predniSONE (STERAPRED UNI-PAK 21 TAB) 10 MG (21) TBPK tablet    Sig: Take as directed on the packaging.    Dispense:  21 tablet    Refill:  0     Order Specific Question:   Supervising Provider    Answer:   Quentin Angst [id number]    Follow-up: Return if symptoms worsen or fail to improve.   Loletta Specter PA

## 2017-08-12 ENCOUNTER — Ambulatory Visit: Payer: Medicaid Other | Admitting: Obstetrics and Gynecology

## 2017-08-20 ENCOUNTER — Ambulatory Visit: Payer: Medicaid Other

## 2017-08-20 ENCOUNTER — Ambulatory Visit: Payer: Medicaid Other | Admitting: Obstetrics & Gynecology

## 2017-08-27 ENCOUNTER — Ambulatory Visit: Payer: Medicaid Other

## 2017-08-30 ENCOUNTER — Ambulatory Visit: Payer: Medicaid Other

## 2017-10-09 ENCOUNTER — Ambulatory Visit (INDEPENDENT_AMBULATORY_CARE_PROVIDER_SITE_OTHER): Payer: Medicaid Other | Admitting: Obstetrics and Gynecology

## 2017-10-09 ENCOUNTER — Other Ambulatory Visit (HOSPITAL_COMMUNITY)
Admission: RE | Admit: 2017-10-09 | Discharge: 2017-10-09 | Disposition: A | Discharge status: Home / Self Care | Payer: Medicaid Other | Source: Ambulatory Visit | Attending: Obstetrics and Gynecology | Admitting: Obstetrics and Gynecology

## 2017-10-09 ENCOUNTER — Encounter: Payer: Self-pay | Admitting: Obstetrics and Gynecology

## 2017-10-09 DIAGNOSIS — Z3202 Encounter for pregnancy test, result negative: Secondary | ICD-10-CM | POA: Diagnosis not present

## 2017-10-09 DIAGNOSIS — R102 Pelvic and perineal pain: Secondary | ICD-10-CM | POA: Insufficient documentation

## 2017-10-09 DIAGNOSIS — N938 Other specified abnormal uterine and vaginal bleeding: Secondary | ICD-10-CM | POA: Diagnosis not present

## 2017-10-09 LAB — POCT URINE PREGNANCY: Preg Test, Ur: NEGATIVE

## 2017-10-09 LAB — POCT URINALYSIS DIPSTICK
BILIRUBIN UA: NEGATIVE
GLUCOSE UA: NEGATIVE
Ketones, UA: NEGATIVE
LEUKOCYTES UA: NEGATIVE
NITRITE UA: NEGATIVE
Protein, UA: NEGATIVE
RBC UA: NEGATIVE
Spec Grav, UA: 1.01 (ref 1.010–1.025)
Urobilinogen, UA: 0.2 E.U./dL
pH, UA: 7 (ref 5.0–8.0)

## 2017-10-09 NOTE — Progress Notes (Signed)
Patient reports pelvic and back pain and is unsure of the cause, pt states that she also was suppose to follow up after having LEEP procedure done in 2013 and never did. Pt also complains of vaginal discharge and irritation.   Ms Donna Stark presents today with several complaints as noted above. 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 This was relayed to her.  The vaginal discharge/irritation has been present for several weeks. She reports a H/O BV in the past. Last IC was a month ago. H/O BTL 04/2015.  She has had irregular cycles this past year. No cycle from Feb to Aug. LMP 07/05/17.  She feels some lower abd/pelvic pain at times, bloating and cramps as well.  She has noted some increased urination but no dysuria.   ROS  CV: no CP Pulm: no SOB GU: as noted in HPI GI: no constipation or bowel changes  PSH: H/O c section  PE AF VSS Lungs clear Heart RRR Abd soft BS non tender GU nl EGBUS, scant white discharge, cervix no lesion,uterus small mobile non tender no masses  A/P Vaginal Discharge        Urinary frequency        DUB  Will check UC, UPT, vaginal cultures and GYN U/S Suspect pt's Sx are related to DUB, perimenopausal. Briefly discussed Tx options. Pt to return after U/S completed to formulate a TX plan

## 2017-10-09 NOTE — Patient Instructions (Signed)
Dysfunctional Uterine Bleeding °Dysfunctional uterine bleeding is abnormal bleeding from the uterus. Dysfunctional uterine bleeding includes: °· A period that comes earlier or later than usual. °· A period that is lighter, heavier, or has blood clots. °· Bleeding between periods. °· Skipping one or more periods. °· Bleeding after sexual intercourse. °· Bleeding after menopause. ° °Follow these instructions at home: °Pay attention to any changes in your symptoms. Follow these instructions to help with your condition: °Eating and drinking °· Eat well-balanced meals. Include foods that are high in iron, such as liver, meat, shellfish, green leafy vegetables, and eggs. °· If you become constipated: °? Drink plenty of water. °? Eat fruits and vegetables that are high in water and fiber, such as spinach, carrots, raspberries, apples, and mango. °Medicines °· Take over-the-counter and prescription medicines only as told by your health care provider. °· Do not change medicines without talking with your health care provider. °· Aspirin or medicines that contain aspirin may make the bleeding worse. Do not take those medicines: °? During the week before your period. °? During your period. °· If you were prescribed iron pills, take them as told by your health care provider. Iron pills help to replace iron that your body loses because of this condition. °Activity °· If you need to change your sanitary pad or tampon more than one time every 2 hours: °? Lie in bed with your feet raised (elevated). °? Place a cold pack on your lower abdomen. °? Rest as much as possible until the bleeding stops or slows down. °· Do not try to lose weight until the bleeding has stopped and your blood iron level is back to normal. °Other Instructions °· For two months, write down: °? When your period starts. °? When your period ends. °? When any abnormal bleeding occurs. °? What problems you notice. °· Keep all follow up visits as told by your health  care provider. This is important. °Contact a health care provider if: °· You get light-headed or weak. °· You have nausea and vomiting. °· You cannot eat or drink without vomiting. °· You feel dizzy or have diarrhea while you are taking medicines. °· You are taking birth control pills or hormones, and you want to change them or stop taking them. °Get help right away if: °· You develop a fever or chills. °· You need to change your sanitary pad or tampon more than one time per hour. °· Your bleeding becomes heavier, or your flow contains clots more often. °· You develop pain in your abdomen. °· You lose consciousness. °· You develop a rash. °This information is not intended to replace advice given to you by your health care provider. Make sure you discuss any questions you have with your health care provider. °Document Released: 10/19/2000 Document Revised: 03/29/2016 Document Reviewed: 01/17/2015 °Elsevier Interactive Patient Education © 2018 [employer] ° °

## 2017-10-10 LAB — CERVICOVAGINAL ANCILLARY ONLY
BACTERIAL VAGINITIS: POSITIVE — AB
CANDIDA VAGINITIS: NEGATIVE

## 2017-10-11 LAB — URINE CULTURE: ORGANISM ID, BACTERIA: NO GROWTH

## 2017-10-15 ENCOUNTER — Other Ambulatory Visit: Payer: Self-pay

## 2017-10-15 DIAGNOSIS — B9689 Other specified bacterial agents as the cause of diseases classified elsewhere: Secondary | ICD-10-CM

## 2017-10-15 DIAGNOSIS — N760 Acute vaginitis: Principal | ICD-10-CM

## 2017-10-15 MED ORDER — METRONIDAZOLE 500 MG PO TABS: 500.0000 mg | ORAL_TABLET | Freq: Two times a day (BID) | ORAL | 0 refills | Status: AC

## 2017-10-16 ENCOUNTER — Ambulatory Visit (HOSPITAL_COMMUNITY): Payer: Medicaid Other

## 2017-10-18 ENCOUNTER — Other Ambulatory Visit (HOSPITAL_COMMUNITY): Payer: Medicaid Other

## 2017-10-21 ENCOUNTER — Ambulatory Visit (HOSPITAL_COMMUNITY)
Admission: RE | Admit: 2017-10-21 | Discharge: 2017-10-21 | Disposition: A | Discharge status: Home / Self Care | Payer: Medicaid Other | Source: Ambulatory Visit | Attending: Obstetrics and Gynecology | Admitting: Obstetrics and Gynecology

## 2017-10-21 ENCOUNTER — Ambulatory Visit: Payer: Medicaid Other | Admitting: Obstetrics and Gynecology

## 2017-10-21 DIAGNOSIS — N938 Other specified abnormal uterine and vaginal bleeding: Secondary | ICD-10-CM | POA: Insufficient documentation

## 2017-10-22 NOTE — Progress Notes (Signed)
Patient not seen by provider as the ultrasound was scheduled later in the day. Patient to return following ultrasound to discuss results and further management

## 2017-10-30 ENCOUNTER — Ambulatory Visit: Payer: Medicaid Other | Admitting: Obstetrics and Gynecology

## 2017-11-08 ENCOUNTER — Ambulatory Visit
Admission: RE | Admit: 2017-11-08 | Discharge: 2017-11-08 | Disposition: A | Discharge status: Home / Self Care | Payer: Medicaid Other | Source: Ambulatory Visit | Attending: Obstetrics & Gynecology | Admitting: Obstetrics & Gynecology

## 2017-11-08 DIAGNOSIS — Z1231 Encounter for screening mammogram for malignant neoplasm of breast: Secondary | ICD-10-CM

## 2017-12-05 ENCOUNTER — Other Ambulatory Visit: Payer: Self-pay

## 2017-12-05 ENCOUNTER — Encounter (HOSPITAL_COMMUNITY): Payer: Self-pay | Admitting: Emergency Medicine

## 2017-12-05 ENCOUNTER — Ambulatory Visit (HOSPITAL_COMMUNITY)
Admission: EM | Admit: 2017-12-05 | Discharge: 2017-12-05 | Disposition: A | Discharge status: Home / Self Care | Payer: Medicaid Other | Attending: Family Medicine | Admitting: Family Medicine

## 2017-12-05 DIAGNOSIS — R2242 Localized swelling, mass and lump, left lower limb: Secondary | ICD-10-CM | POA: Diagnosis not present

## 2017-12-05 DIAGNOSIS — N898 Other specified noninflammatory disorders of vagina: Secondary | ICD-10-CM

## 2017-12-05 LAB — POCT URINALYSIS DIP (DEVICE)
Bilirubin Urine: NEGATIVE
GLUCOSE, UA: NEGATIVE mg/dL
Hgb urine dipstick: NEGATIVE
KETONES UR: NEGATIVE mg/dL
Leukocytes, UA: NEGATIVE
Nitrite: NEGATIVE
Protein, ur: NEGATIVE mg/dL
Specific Gravity, Urine: 1.025 (ref 1.005–1.030)
UROBILINOGEN UA: 0.2 mg/dL (ref 0.0–1.0)
pH: 7 (ref 5.0–8.0)

## 2017-12-05 LAB — POCT PREGNANCY, URINE: PREG TEST UR: NEGATIVE

## 2017-12-05 MED ORDER — METRONIDAZOLE 500 MG PO TABS: 500.0000 mg | ORAL_TABLET | Freq: Two times a day (BID) | ORAL | 0 refills | Status: AC

## 2017-12-05 MED ORDER — FLUCONAZOLE 200 MG PO TABS: ORAL_TABLET | ORAL | 0 refills | Status: DC

## 2017-12-05 NOTE — Discharge Instructions (Signed)
We will start treatment for yeast and bacteria.  We are waiting for final results for these tests, also screening for std's. Will call you with any positive results.  Try to wear loose fitting shoes to prevent pressure to nodules on your foot. Ice, elevation and ibuprofen as needed if becomes inflamed or more painful. Please call and make an appointment with podiatry, [facility] contact information provided, to recheck and provide further recommendations. Continue to follow with gynecology if vaginal symptoms persist.

## 2017-12-05 NOTE — ED Provider Notes (Signed)
[facility]    CSN: [id number] Arrival date & time: 12/05/17  1002     History   Chief Complaint Chief Complaint  Patient presents with  . Vaginal Itching  . Foot Pain    HPI Donna CoddingSelina Stark is a 42 y.o. female.   Kara DiesSelina presents with complaints of vaginal irritation and discharge, with noticeable odor which she feels has been present for approximately 1 month. [facility] Symptoms minimally improved. States she used OTC yeast treatment which mildly helped. Occasionally with pelvic pain which comes and goes, denies current pain. No known fevers or back pain. Without vaginal bleeding. LMP 1/12. She is sexually active with 1 partner, does not always use condoms. No known std exposure. Denies vaginal itching. Denies gi complaints. Denies urinary complaints.   Also with concerns of nodules to left dorsal foot. Originally was one, was told it was benign, but now a second has developed. Without redness, drainage, swelling. Tender if rubbed against. No known injury.    ROS per HPI.       History reviewed. No pertinent past medical history.  Patient Active Problem List   Diagnosis Date Noted  . Pelvic pain 10/09/2017  . DUB (dysfunctional uterine bleeding) 10/09/2017    Past Surgical History:  Procedure Laterality Date  . CERVICAL BIOPSY  W/ LOOP ELECTRODE EXCISION  2013  . CESAREAN SECTION    . TUBAL LIGATION  2016    OB History    Gravida Para Term Preterm AB Living   3 2 2   1 2    SAB TAB Ectopic Multiple Live Births   1       2       Home Medications    Prior to Admission medications   Medication Sig Start Date End Date Taking? Authorizing Provider  fluconazole (DIFLUCAN) 200 MG tablet Take once today. Take second pill at completion of antibiotics. 12/05/17   Georgetta HaberBurky, Natalie B, NP  metroNIDAZOLE (FLAGYL) 500 MG tablet Take 1 tablet (500 mg total) by mouth 2 (two) times daily for 7 days. 12/05/17 12/12/17  Georgetta HaberBurky,  Natalie B, NP    Family History Family History  Problem Relation Age of Onset  . COPD Mother   . Hypertension Mother   . Diabetes Mother   . Arthritis Mother   . Arthritis Sister   . Lung cancer Maternal Grandmother   . Diabetes Maternal Grandmother     Social History Social History   Tobacco Use  . Smoking status: Never Smoker  . Smokeless tobacco: Never Used  Substance Use Topics  . Alcohol use: Yes    Comment: occ. wine   . Drug use: No     Allergies   Sulfur; Terbinafine and related; and Penicillins   Review of Systems Review of Systems   Physical Exam Triage Vital Signs ED Triage Vitals  Enc Vitals Group     BP 12/05/17 1019 113/62     Pulse Rate 12/05/17 1019 66     Resp 12/05/17 1019 18     Temp 12/05/17 1019 98.2 F (36.8 C)     Temp src --      SpO2 12/05/17 1019 100 %     Weight --      Height --      Head Circumference --      Peak Flow --      Pain Score 12/05/17 1022 0     Pain Loc --  Pain Edu? --      Excl. in GC? --    No data found.  Updated Vital Signs BP 113/62   Pulse 66   Temp 98.2 F (36.8 C)   Resp 18   LMP 11/16/2017   SpO2 100%   Visual Acuity Right Eye Distance:   Left Eye Distance:   Bilateral Distance:    Right Eye Near:   Left Eye Near:    Bilateral Near:     Physical Exam  Constitutional: She is oriented to person, place, and time. She appears well-developed and well-nourished. No distress.  Cardiovascular: Normal rate, regular rhythm and normal heart sounds.  Pulmonary/Chest: Effort normal and breath sounds normal.  Abdominal: Soft. She exhibits no mass. There is no tenderness. There is no rebound and no guarding. No hernia.  Genitourinary: Vagina normal and uterus normal. There is no rash, tenderness or lesion on the right labia. There is no rash, tenderness or lesion on the left labia. Cervix exhibits no motion tenderness, no discharge and no friability. Right adnexum displays no tenderness and no  fullness. Left adnexum displays no tenderness and no fullness.  Genitourinary Comments: Thin white vaginal discharge, states she feels internal "irritation" with bimanual exam; otherwise without pain or bleeding.   Musculoskeletal:       Left foot: There is tenderness. There is normal range of motion, no bony tenderness, no swelling, normal capillary refill and no crepitus.       Feet:  Two raised round nodules, firm rubbery consistency with mild tenderness; without redness or fluctuance; skin toned   Neurological: She is alert and oriented to person, place, and time.  Skin: Skin is warm and dry.     UC Treatments / Results  Labs (all labs ordered are listed, but only abnormal results are displayed) Labs Reviewed  CERVICOVAGINAL ANCILLARY ONLY    EKG  EKG Interpretation None       Radiology No results found.  Procedures Procedures (including critical care time)  Medications Ordered in UC Medications - No data to display   Initial Impression / Assessment and Plan / UC Course  I have reviewed the triage vital signs and the nursing notes.  Pertinent labs & imaging results that were available during my care of the patient were reviewed by me and considered in my medical decision making (see chart for details).     Will treat with diflucan and flagyl at this time, vaginal cytology pending at this time. Will notify of any positive findings and if any changes to treatment are needed.  Continue to follow with gynecology for recheck as needed. Encouraged condom use. Recommended follow up with podiatry for nodule recheck. Loose fitting shoes recommended.Patient verbalized understanding and agreeable to plan.    Final Clinical Impressions(s) / UC Diagnoses   Final diagnoses:  Vaginal discharge  Mass of foot, left    ED Discharge Orders        Ordered    metroNIDAZOLE (FLAGYL) 500 MG tablet  2 times daily     12/05/17 1049    fluconazole (DIFLUCAN) 200 MG tablet      12/05/17 1049       Controlled Substance Prescriptions [place] Controlled Substance Registry consulted? Not Applicable   Georgetta Haber, NP 12/05/17 1058

## 2017-12-05 NOTE — ED Triage Notes (Signed)
Pt c/o vaginal irritation states she was dx for vaginal infection a month ago, states she was given medicines for it but it doesn't feel like it worked. Pt c/o discharge and odor. Pt also c/o two knots on the side of her L foot. Pt states they usually go away but they aren't. Pt ambulatory with steady gait, denies injury.

## 2017-12-09 ENCOUNTER — Telehealth (HOSPITAL_COMMUNITY): Payer: Self-pay | Admitting: Emergency Medicine

## 2017-12-09 LAB — CERVICOVAGINAL ANCILLARY ONLY
Bacterial vaginitis: NEGATIVE
CANDIDA VAGINITIS: NEGATIVE
CHLAMYDIA, DNA PROBE: NEGATIVE
NEISSERIA GONORRHEA: NEGATIVE
TRICH (WINDOWPATH): NEGATIVE

## 2017-12-09 MED ORDER — METRONIDAZOLE 0.75 % VA GEL: 1.0000 | Freq: Every day | VAGINAL | 0 refills | Status: AC

## 2017-12-09 NOTE — Telephone Encounter (Signed)
Pt called stating she took 1 pill for diflucan and started taking the flagyl and she had some sort of reaction, hives on her face. Pt requesting a different medication for vaginal itching. Metrogel sent to pharmacy. Pt instructed to return to be seen if symptoms do not resolve or if hives do not resolve. Pt agreeable to plan.

## 2017-12-14 ENCOUNTER — Other Ambulatory Visit: Payer: Self-pay

## 2017-12-14 ENCOUNTER — Encounter (HOSPITAL_COMMUNITY): Payer: Self-pay

## 2017-12-14 ENCOUNTER — Ambulatory Visit (HOSPITAL_COMMUNITY)
Admission: EM | Admit: 2017-12-14 | Discharge: 2017-12-14 | Disposition: A | Discharge status: Home / Self Care | Payer: Medicaid Other | Attending: Family Medicine | Admitting: Family Medicine

## 2017-12-14 DIAGNOSIS — Z8261 Family history of arthritis: Secondary | ICD-10-CM | POA: Diagnosis not present

## 2017-12-14 DIAGNOSIS — Z825 Family history of asthma and other chronic lower respiratory diseases: Secondary | ICD-10-CM | POA: Diagnosis not present

## 2017-12-14 DIAGNOSIS — Z833 Family history of diabetes mellitus: Secondary | ICD-10-CM | POA: Diagnosis not present

## 2017-12-14 DIAGNOSIS — T7840XA Allergy, unspecified, initial encounter: Secondary | ICD-10-CM | POA: Insufficient documentation

## 2017-12-14 DIAGNOSIS — Z880 Allergy status to penicillin: Secondary | ICD-10-CM | POA: Diagnosis not present

## 2017-12-14 DIAGNOSIS — Z8249 Family history of ischemic heart disease and other diseases of the circulatory system: Secondary | ICD-10-CM | POA: Diagnosis not present

## 2017-12-14 DIAGNOSIS — X58XXXA Exposure to other specified factors, initial encounter: Secondary | ICD-10-CM | POA: Insufficient documentation

## 2017-12-14 DIAGNOSIS — L299 Pruritus, unspecified: Secondary | ICD-10-CM | POA: Diagnosis present

## 2017-12-14 DIAGNOSIS — Z888 Allergy status to other drugs, medicaments and biological substances status: Secondary | ICD-10-CM | POA: Insufficient documentation

## 2017-12-14 DIAGNOSIS — Z882 Allergy status to sulfonamides status: Secondary | ICD-10-CM | POA: Diagnosis not present

## 2017-12-14 MED ORDER — PREDNISONE 20 MG PO TABS: 40.0000 mg | ORAL_TABLET | Freq: Every day | ORAL | 0 refills | Status: AC

## 2017-12-14 NOTE — ED Provider Notes (Signed)
[facility]    CSN: [id number] Arrival date & time: 12/14/17  1829     History   Chief Complaint Chief Complaint  Patient presents with  . Allergic Reaction    HPI Donna Stark is a 42 y.o. female.   42 year old female comes in for 3-day history of lip swelling and pruritic rash.  States possible reaction to Diflucan and Flagyl.  She was recently seen and was treated empirically for BV and yeast.  States she took both Diflucan and Flagyl, and started noticing lip swelling with throat irritation, and pruritic rash.  She stopped the medications, and has been taking Benadryl with temporary relief.  She denies shortness of breath, swelling of her throat, trouble swallowing, drooling, tripoding.  Denies fever, chills, night sweats.  She states she has taken both Diflucan and Flagyl in the past without problems.  Denies any new foods, other new exposures that could have caused the symptoms. Complaining of some throat irritation.       History reviewed. No pertinent past medical history.  Patient Active Problem List   Diagnosis Date Noted  . Pelvic pain 10/09/2017  . DUB (dysfunctional uterine bleeding) 10/09/2017    Past Surgical History:  Procedure Laterality Date  . CERVICAL BIOPSY  W/ LOOP ELECTRODE EXCISION  2013  . CESAREAN SECTION    . TUBAL LIGATION  2016    OB History    Gravida Para Term Preterm AB Living   3 2 2   1 2    SAB TAB Ectopic Multiple Live Births   1       2       Home Medications    Prior to Admission medications   Medication Sig Start Date End Date Taking? Authorizing Provider  fluconazole (DIFLUCAN) 200 MG tablet Take once today. Take second pill at completion of antibiotics. 12/05/17  Yes Burky, Dorene Grebe B, NP  metroNIDAZOLE (METROGEL VAGINAL) 0.75 % vaginal gel Place 1 Applicatorful vaginally at bedtime for 5 doses. 12/09/17 12/14/17 Yes Burky, Barron Alvine, NP  predniSONE (DELTASONE) 20 MG tablet Take 2 tablets (40 mg total) by mouth daily  for 4 days. 12/14/17 12/18/17  Belinda Fisher, PA-C    Family History Family History  Problem Relation Age of Onset  . COPD Mother   . Hypertension Mother   . Diabetes Mother   . Arthritis Mother   . Arthritis Sister   . Lung cancer Maternal Grandmother   . Diabetes Maternal Grandmother     Social History Social History   Tobacco Use  . Smoking status: Never Smoker  . Smokeless tobacco: Never Used  Substance Use Topics  . Alcohol use: Yes    Comment: occ. wine   . Drug use: No     Allergies   Sulfur; Terbinafine and related; and Penicillins   Review of Systems Review of Systems  Reason unable to perform ROS: See HPI as above.     Physical Exam Triage Vital Signs ED Triage Vitals [12/14/17 1930]  Enc Vitals Group     BP 104/66     Pulse Rate 61     Resp 16     Temp 97.9 F (36.6 C)     Temp Source Oral     SpO2 100 %     Weight      Height      Head Circumference      Peak Flow      Pain Score      Pain Loc  Pain Edu?      Excl. in GC?    No data found.  Updated Vital Signs BP 104/66 (BP Location: Left Arm)   Pulse 61   Temp 97.9 F (36.6 C) (Oral)   Resp 16   LMP 11/16/2017   SpO2 100%   Physical Exam  Constitutional: She is oriented to person, place, and time. She appears well-developed and well-nourished. No distress.  HENT:  Head: Normocephalic and atraumatic.  Right Ear: Tympanic membrane, external ear and ear canal normal. Tympanic membrane is not erythematous and not bulging.  Left Ear: Tympanic membrane, external ear and ear canal normal. Tympanic membrane is not erythematous and not bulging.  Nose: Nose normal. Right sinus exhibits no maxillary sinus tenderness and no frontal sinus tenderness. Left sinus exhibits no maxillary sinus tenderness and no frontal sinus tenderness.  Mouth/Throat: Uvula is midline, oropharynx is clear and moist and mucous membranes are normal. No oral lesions. No uvula swelling. No posterior oropharyngeal edema  or posterior oropharyngeal erythema. Tonsils are 2+ on the right. Tonsils are 2+ on the left. No tonsillar exudate.  Generalized swelling of the lips, without rashes/vesicles seen.  No tenderness on palpation.  No oral lesions seen.  No tenderness on palpation of the gums.  Eyes: Conjunctivae are normal. Pupils are equal, round, and reactive to light.  Neck: Normal range of motion. Neck supple.  Cardiovascular: Normal rate, regular rhythm and normal heart sounds. Exam reveals no gallop and no friction rub.  No murmur heard. Pulmonary/Chest: Effort normal and breath sounds normal. She has no decreased breath sounds. She has no wheezes. She has no rhonchi. She has no rales.  Lymphadenopathy:    She has no cervical adenopathy.  Neurological: She is alert and oriented to person, place, and time.  Skin: Skin is warm and dry.  Maculopapular rash on the arms. Scratch marks seen.   Psychiatric: She has a normal mood and affect. Her behavior is normal. Judgment normal.     UC Treatments / Results  Labs (all labs ordered are listed, but only abnormal results are displayed) Labs Reviewed  CULTURE, GROUP A STREP [facility]    EKG  EKG Interpretation None       Radiology No results found.  Procedures Procedures (including critical care time)  Medications Ordered in UC Medications - No data to display   Initial Impression / Assessment and Plan / UC Course  I have reviewed the triage vital signs and the nursing notes.  Pertinent labs & imaging results that were available during my care of the patient were reviewed by me and considered in my medical decision making (see chart for details).    No alarming signs on exam.  Rapid strep negative.  Discussed with patient unsure if reaction is due to Diflucan/Flagyl as she has taken in the past without problems.  To monitor closely for other new exposures that could have caused the symptoms.  Prednisone as directed.  Continue antihistamine for  pruritus.  Return precautions given.  Patient expresses understanding and agrees to plan.  Final Clinical Impressions(s) / UC Diagnoses   Final diagnoses:  Allergic reaction, initial encounter    ED Discharge Orders        Ordered    predniSONE (DELTASONE) 20 MG tablet  Daily     12/14/17 2020        Belinda FisherYu, Amy V, PA-C 12/14/17 2023

## 2017-12-14 NOTE — ED Triage Notes (Signed)
[facility] to medication given on 12/05/17 which was Diflucan, pt lip is dark and swollen x3 days, pt states she called [facility] the next day 12/06/17 and was told to take benadryl, pt states conditions have not changed

## 2017-12-14 NOTE — Discharge Instructions (Signed)
Rapid strep negative. Start prednisone as directed. Continue benadryl/allergy medicine as directed for itchiness. I am unsure if this is true allergy to diflucan/flagyl as you have had it in the past.  Monitor closely for any new exposures/food that causes symptoms.  If experiencing any worsening symptoms, swelling of the throat, trouble breathing, trouble swallowing, drooling, leaning forward, go to the emergency department for further evaluation.

## 2017-12-16 LAB — POCT RAPID STREP A: Streptococcus, Group A Screen (Direct): NEGATIVE

## 2017-12-17 LAB — CULTURE, GROUP A STREP (THRC)

## 2017-12-25 ENCOUNTER — Ambulatory Visit (HOSPITAL_COMMUNITY)
Admission: EM | Admit: 2017-12-25 | Discharge: 2017-12-25 | Disposition: A | Discharge status: Home / Self Care | Payer: Medicaid Other | Attending: Family Medicine | Admitting: Family Medicine

## 2017-12-25 ENCOUNTER — Encounter (HOSPITAL_COMMUNITY): Payer: Self-pay | Admitting: Emergency Medicine

## 2017-12-25 ENCOUNTER — Other Ambulatory Visit: Payer: Self-pay

## 2017-12-25 DIAGNOSIS — J069 Acute upper respiratory infection, unspecified: Secondary | ICD-10-CM

## 2017-12-25 DIAGNOSIS — B9789 Other viral agents as the cause of diseases classified elsewhere: Secondary | ICD-10-CM

## 2017-12-25 MED ORDER — GUAIFENESIN-DM 100-10 MG/5ML PO SYRP: 10.0000 mL | ORAL_SOLUTION | Freq: Four times a day (QID) | ORAL | 0 refills | Status: AC | PRN

## 2017-12-25 MED ORDER — FLUTICASONE PROPIONATE 50 MCG/ACT NA SUSP: 1.0000 | Freq: Every day | NASAL | 2 refills | Status: AC

## 2017-12-25 MED ORDER — CETIRIZINE HCL 10 MG PO CAPS: 10.0000 mg | ORAL_CAPSULE | Freq: Every day | ORAL | 0 refills | Status: DC

## 2017-12-25 NOTE — Discharge Instructions (Signed)
Please take Zyrtec and Flonase daily for congestion.  Please use cough syrup as needed for cough.  Tylenol and ibuprofen for headache.  Please return if symptoms not improving in 1 week, develop worsening of symptoms, difficulty breathing, nausea, vomiting, fever.

## 2017-12-25 NOTE — ED Provider Notes (Signed)
[facility]    CSN: [id number] Arrival date & time: 12/25/17  1127     History   Chief Complaint Chief Complaint  Patient presents with  . Nasal Congestion    HPI Deztinee Lohmeyer is a 42 y.o. female no sig PMH; Patient is presenting with URI symptoms- congestion, cough, sore throat only with coughing.  Symptoms have been going on for 3 days. Patient has not taken anything besides ibuprofen for headache.. Denies fever, nausea, vomiting, diarrhea. Denies shortness of breath and chest pain.    HPI  History reviewed. No pertinent past medical history.  Patient Active Problem List   Diagnosis Date Noted  . Pelvic pain 10/09/2017  . DUB (dysfunctional uterine bleeding) 10/09/2017    Past Surgical History:  Procedure Laterality Date  . CERVICAL BIOPSY  W/ LOOP ELECTRODE EXCISION  2013  . CESAREAN SECTION    . TUBAL LIGATION  2016    OB History    Gravida Para Term Preterm AB Living   3 2 2   1 2    SAB TAB Ectopic Multiple Live Births   1       2       Home Medications    Prior to Admission medications   Medication Sig Start Date End Date Taking? Authorizing Provider  Cetirizine HCl 10 MG CAPS Take 1 capsule (10 mg total) by mouth daily for 10 days. 12/25/17 01/04/18  Imo Cumbie C, PA-C  fluticasone (FLONASE) 50 MCG/ACT nasal spray Place 1-2 sprays into both nostrils daily. 12/25/17   Lutie Pickler C, PA-C  guaiFENesin-dextromethorphan (ROBITUSSIN DM) 100-10 MG/5ML syrup Take 10 mLs by mouth every 6 (six) hours as needed for up to 5 days for cough. 12/25/17 12/30/17  Lenya Sterne, Junius Creamer, PA-C    Family History Family History  Problem Relation Age of Onset  . COPD Mother   . Hypertension Mother   . Diabetes Mother   . Arthritis Mother   . Arthritis Sister   . Lung cancer Maternal Grandmother   . Diabetes Maternal Grandmother     Social History Social History   Tobacco Use  . Smoking status: Never Smoker  . Smokeless tobacco: Never Used  Substance  Use Topics  . Alcohol use: Yes    Comment: occ. wine   . Drug use: No     Allergies   Sulfur; Terbinafine and related; and Penicillins   Review of Systems Review of Systems  Constitutional: Positive for fatigue. Negative for chills and fever.  HENT: Positive for congestion, rhinorrhea and sore throat. Negative for ear pain, sinus pressure and trouble swallowing.   Respiratory: Positive for cough. Negative for chest tightness and shortness of breath.   Cardiovascular: Negative for chest pain.  Gastrointestinal: Negative for abdominal pain, nausea and vomiting.  Musculoskeletal: Negative for myalgias.  Skin: Negative for rash.  Neurological: Negative for dizziness, light-headedness and headaches.     Physical Exam Triage Vital Signs ED Triage Vitals [12/25/17 1232]  Enc Vitals Group     BP 111/64     Pulse Rate 96     Resp 18     Temp 99.7 F (37.6 C)     Temp src      SpO2 100 %     Weight      Height      Head Circumference      Peak Flow      Pain Score      Pain Loc  Pain Edu?      Excl. in GC?    No data found.  Updated Vital Signs BP 111/64   Pulse 96   Temp 99.7 F (37.6 C)   Resp 18   SpO2 100%   Visual Acuity Right Eye Distance:   Left Eye Distance:   Bilateral Distance:    Right Eye Near:   Left Eye Near:    Bilateral Near:     Physical Exam  Constitutional: She appears well-developed and well-nourished. No distress.  HENT:  Head: Normocephalic and atraumatic.  Bilateral TMs nonerythematous, nasal mucosa erythematous with rhinorrhea present, posterior oropharynx mildly erythematous, no tonsillar enlargement or exudate.  Eyes: Conjunctivae are normal.  Neck: Neck supple.  Cardiovascular: Normal rate and regular rhythm.  No murmur heard. Pulmonary/Chest: Effort normal and breath sounds normal. No respiratory distress.  Breathing comfortably at rest, clear to auscultation bilaterally, no adventitious sounds appreciated  Abdominal:  Soft. There is no tenderness.  Musculoskeletal: She exhibits no edema.  Neurological: She is alert.  Skin: Skin is warm and dry.  Psychiatric: She has a normal mood and affect.  Nursing note and vitals reviewed.    UC Treatments / Results  Labs (all labs ordered are listed, but only abnormal results are displayed) Labs Reviewed - No data to display  EKG  EKG Interpretation None       Radiology No results found.  Procedures Procedures (including critical care time)  Medications Ordered in UC Medications - No data to display   Initial Impression / Assessment and Plan / UC Course  I have reviewed the triage vital signs and the nursing notes.  Pertinent labs & imaging results that were available during my care of the patient were reviewed by me and considered in my medical decision making (see chart for details).     Patient presents with symptoms likely from a viral upper respiratory infection.Patient is nontoxic appearing and not in need of emergent medical intervention.  Recommended symptom control, provided Zyrtec and Flonase also Robitussin-DM for cough.  Return if symptoms fail to improve in 1-2 weeks or you develop shortness of breath, chest pain, severe headache. Patient states understanding and is agreeable. Discussed strict return precautions. Patient verbalized understanding and is agreeable with plan.  Final Clinical Impressions(s) / UC Diagnoses   Final diagnoses:  Viral URI with cough    ED Discharge Orders        Ordered    Cetirizine HCl 10 MG CAPS  Daily     12/25/17 1334    fluticasone (FLONASE) 50 MCG/ACT nasal spray  Daily     12/25/17 1334    guaiFENesin-dextromethorphan (ROBITUSSIN DM) 100-10 MG/5ML syrup  Every 6 hours PRN     12/25/17 1334       Controlled Substance Prescriptions [place] Controlled Substance Registry consulted? Not Applicable   Lew DawesWieters, Tenae Graziosi C, [place] JerseyPA-C 12/25/17 1346

## 2017-12-25 NOTE — ED Triage Notes (Signed)
Pt c/o nasal congestion x 3 days  

## 2018-01-15 ENCOUNTER — Other Ambulatory Visit: Payer: Self-pay

## 2018-01-15 ENCOUNTER — Ambulatory Visit (INDEPENDENT_AMBULATORY_CARE_PROVIDER_SITE_OTHER): Payer: Medicaid Other | Admitting: Physician Assistant

## 2018-01-15 ENCOUNTER — Encounter (INDEPENDENT_AMBULATORY_CARE_PROVIDER_SITE_OTHER): Payer: Self-pay | Admitting: Physician Assistant

## 2018-01-15 VITALS — BP 100/62 | HR 69 | Temp 98.1°F | Wt 154.4 lb

## 2018-01-15 DIAGNOSIS — L309 Dermatitis, unspecified: Secondary | ICD-10-CM | POA: Diagnosis not present

## 2018-01-15 MED ORDER — TRIAMCINOLONE ACETONIDE 0.5 % EX OINT: 1.0000 "application " | TOPICAL_OINTMENT | Freq: Two times a day (BID) | CUTANEOUS | 0 refills | Status: AC

## 2018-01-15 MED ORDER — HYDROXYZINE HCL 25 MG PO TABS: 25.0000 mg | ORAL_TABLET | Freq: Every day | ORAL | 0 refills | Status: AC

## 2018-01-15 NOTE — Patient Instructions (Signed)

## 2018-01-15 NOTE — Progress Notes (Signed)
Subjective:  Patient ID: Donna CoddingSelina Sotomayor, female    DOB: 09/19/1976  Age: 42 y.o. MRN: [id number]  CC: skin lesion  HPI  Donna Stark is a 42 y.o. female with a medical history of onychomycosis presents with a skin lesion on her back attributed to a "bug bite" since three weeks ago. Did not actually see a bug but felt a sensation of movement on the back. Lesions are pruritic and located only on the back. There is no f/c/n/v, swelling, abdominal pain, SOB, or HA.    Outpatient Medications Prior to Visit  Medication Sig Dispense Refill  . Cetirizine HCl 10 MG CAPS Take 1 capsule (10 mg total) by mouth daily for 10 days. 10 capsule 0  . fluticasone (FLONASE) 50 MCG/ACT nasal spray Place 1-2 sprays into both nostrils daily. (Patient not taking: Reported on 01/15/2018) 1 g 2   No facility-administered medications prior to visit.      ROS Review of Systems  Constitutional: Negative for chills, fever and malaise/fatigue.  Eyes: Negative for blurred vision.  Respiratory: Negative for shortness of breath.   Cardiovascular: Negative for chest pain and palpitations.  Gastrointestinal: Negative for abdominal pain and nausea.  Genitourinary: Negative for dysuria and hematuria.  Musculoskeletal: Negative for joint pain and myalgias.  Skin: Positive for itching and rash.  Neurological: Negative for tingling and headaches.  Psychiatric/Behavioral: Negative for depression. The patient is not nervous/anxious.     Objective:  BP 100/62 (BP Location: Left Arm, Patient Position: Sitting, Cuff Size: Normal)   Pulse 69   Temp 98.1 F (36.7 C) (Oral)   Wt 154 lb 6.4 oz (70 kg)   SpO2 100%   BMI 24.18 kg/m   BP/Weight 01/15/2018 12/25/2017 12/14/2017  Systolic BP 100 111 104  Diastolic BP 62 64 66  Wt. (Lbs) 154.4 - -  BMI 24.18 - -      Physical Exam  Constitutional: She is oriented to person, place, and time.  Well developed, well nourished, NAD, polite  HENT:  Head: Normocephalic and  atraumatic.  Eyes: No scleral icterus.  Neck: Normal range of motion. Neck supple. No thyromegaly present.  Pulmonary/Chest: Effort normal.  Musculoskeletal: She exhibits no edema.  Neurological: She is alert and oriented to person, place, and time.  Skin: Skin is warm and dry. No rash noted. No erythema. No pallor.  Few small, solitary papules on the lower back and upper back. No erythema, edema, suppuration, crusting, scaling. Excoriations and Koebner phenomenon noted.   Psychiatric: She has a normal mood and affect. Her behavior is normal. Thought content normal.  Vitals reviewed.    Assessment & Plan:    1. Dermatitis -Begin triamcinolone ointment (KENALOG) 0.5 %; Apply 1 application topically 2 (two) times daily.  Dispense: 30 g; Refill: 0 - Begin hydrOXYzine (ATARAX/VISTARIL) 25 MG tablet; Take 1 tablet (25 mg total) by mouth at bedtime.  Dispense: 14 tablet; Refill: 0   Meds ordered this encounter  Medications  . triamcinolone ointment (KENALOG) 0.5 %    Sig: Apply 1 application topically 2 (two) times daily.    Dispense:  30 g    Refill:  0    Order Specific Question:   Supervising Provider    Answer:   Quentin AngstJEGEDE, OLUGBEMIGA E [id number]]  . hydrOXYzine (ATARAX/VISTARIL) 25 MG tablet    Sig: Take 1 tablet (25 mg total) by mouth at bedtime.    Dispense:  14 tablet    Refill:  0    Order Specific  Question:   Supervising Provider    Answer:   Quentin Angst [[id number]]    Follow-up: Return if symptoms worsen or fail to improve.   Loletta Specter PA

## 2018-03-14 ENCOUNTER — Other Ambulatory Visit: Payer: Self-pay

## 2018-03-14 ENCOUNTER — Emergency Department (HOSPITAL_COMMUNITY)
Admission: EM | Admit: 2018-03-14 | Discharge: 2018-03-15 | Disposition: A | Discharge status: Home / Self Care | Payer: Medicaid Other | Attending: Emergency Medicine | Admitting: Emergency Medicine

## 2018-03-14 DIAGNOSIS — R202 Paresthesia of skin: Secondary | ICD-10-CM | POA: Insufficient documentation

## 2018-03-14 DIAGNOSIS — R21 Rash and other nonspecific skin eruption: Secondary | ICD-10-CM | POA: Diagnosis present

## 2018-03-14 DIAGNOSIS — Z79899 Other long term (current) drug therapy: Secondary | ICD-10-CM | POA: Diagnosis not present

## 2018-03-14 NOTE — ED Triage Notes (Signed)
Per pt she has a bug bite on her back surrounded by a rash that now she feels like has a bug in it that she can feel moving

## 2018-03-15 NOTE — ED Notes (Signed)
In to discharge pt but pt was no longer in room.

## 2018-03-15 NOTE — Discharge Instructions (Addendum)
Cause of your symptoms is unclear.   I recommend keeping skin moisturized. Apply hydrocortisone cream and take benadryl to see if this helps.   You need evaluation by dermatologist.

## 2018-03-15 NOTE — ED Provider Notes (Signed)
[facility] EMERGENCY DEPARTMENT Provider Note   CSN: [id number] Arrival date & time: 03/14/18  2156     History   Chief Complaint Chief Complaint  Patient presents with  . Rash    HPI Donna Stark is a 42 y.o. female here for evaluation of formication for the last 1 month.  Reports initially had a sensation of itching to her low back, since has had intermittent sensation of bugs crawling under her skin and itching from her low back up to her neck.  She had a rash initially but thinks rash is coming and going, not sure if there is rash there now. Associated symptoms include burning sensation and tenderness with direct palpation from lumbar back to posterior neck.  States notices symptoms more after she eats. Was seen by PCP for this but states "he didn't even do x-rays", prescribed her itching pills and steroids that she used 2-3 times and stopped because they didn't seem to work. Denies exposure to new topical agents, pets, travel, stay at hotel. No one at home with similar symptoms. No new medications. No fevers, chills, cough, CP, SOB, nausea, vomiting, abdominal pain. No other symptoms reported.   HPI  No past medical history on file.  Patient Active Problem List   Diagnosis Date Noted  . Pelvic pain 10/09/2017  . DUB (dysfunctional uterine bleeding) 10/09/2017    Past Surgical History:  Procedure Laterality Date  . CERVICAL BIOPSY  W/ LOOP ELECTRODE EXCISION  2013  . CESAREAN SECTION    . TUBAL LIGATION  2016     OB History    Gravida  3   Para  2   Term  2   Preterm      AB  1   Living  2     SAB  1   TAB      Ectopic      Multiple      Live Births  2            Home Medications    Prior to Admission medications   Medication Sig Start Date End Date Taking? Authorizing Provider  Cetirizine HCl 10 MG CAPS Take 1 capsule (10 mg total) by mouth daily for 10 days. 12/25/17 01/04/18  Wieters, Hallie C, PA-C  fluticasone (FLONASE)  50 MCG/ACT nasal spray Place 1-2 sprays into both nostrils daily. Patient not taking: Reported on 01/15/2018 12/25/17   Wieters, Fran Lowes C, PA-C  hydrOXYzine (ATARAX/VISTARIL) 25 MG tablet Take 1 tablet (25 mg total) by mouth at bedtime. 01/15/18   Loletta Specter, PA-C  triamcinolone ointment (KENALOG) 0.5 % Apply 1 application topically 2 (two) times daily. 01/15/18   Loletta Specter, PA-C    Family History Family History  Problem Relation Age of Onset  . COPD Mother   . Hypertension Mother   . Diabetes Mother   . Arthritis Mother   . Arthritis Sister   . Lung cancer Maternal Grandmother   . Diabetes Maternal Grandmother     Social History Social History   Tobacco Use  . Smoking status: Never Smoker  . Smokeless tobacco: Never Used  Substance Use Topics  . Alcohol use: Yes    Comment: occ. wine   . Drug use: No     Allergies   Sulfur; Terbinafine and related; and Penicillins   Review of Systems Review of Systems  Skin: Positive for rash.       Itching Formication   All other systems reviewed  and are negative.    Physical Exam Updated Vital Signs BP 107/72 (BP Location: Right Arm)   Pulse 62   Temp 98 F (36.7 C) (Oral)   Resp 18   SpO2 100%   Physical Exam  Constitutional: She is oriented to person, place, and time. She appears well-developed and well-nourished. No distress.  Non toxic  HENT:  Head: Normocephalic and atraumatic.  Nose: Nose normal.  Moist mucous membranes. No intraoral lesions.   Eyes: Pupils are equal, round, and reactive to light. Conjunctivae and EOM are normal.  Neck: Normal range of motion.  Cardiovascular: Normal rate, regular rhythm and intact distal pulses.  2+ DP and radial pulses bilaterally. No LE edema.   Pulmonary/Chest: Effort normal and breath sounds normal.  Abdominal: Soft. Bowel sounds are normal. There is no tenderness.  No G/R/R. No suprapubic or CVA tenderness. Negative Murphy's and McBurney's.     Musculoskeletal: Normal range of motion.  Neurological: She is alert and oriented to person, place, and time.  Skin: Skin is warm and dry. Capillary refill takes less than 2 seconds.  No erythema, edema, warmth, tenderness to skin on CTL spine. Few, healed hyperpigmented linear scars throughout back with minimal excoriation.   No rash, burrowing, linear markings to palms, soles or in between fingers.   Psychiatric: She has a normal mood and affect. Her behavior is normal.  Nursing note and vitals reviewed.    ED Treatments / Results  Labs (all labs ordered are listed, but only abnormal results are displayed) Labs Reviewed - No data to display  EKG None  Radiology No results found.  Procedures Procedures (including critical care time)  Medications Ordered in ED Medications - No data to display   Initial Impression / Assessment and Plan / ED Course  I have reviewed the triage vital signs and the nursing notes.  Pertinent labs & imaging results that were available during my care of the patient were reviewed by me and considered in my medical decision making (see chart for details).     Unclear etiology of formication.  Exam as above reassuring. Considered parasitosis.  She has no h/o thyroid disease, DM, B12/folate deficiencies, immunocompromise. Denies illicit drug use. Given chronicity of symptoms, benign exam as above w/o signs of superimposed infection pt deemed appropriate for dc with topical agents such as antihistamines, hydrocortisone and f/u with PCP for referral to dermatology. I do not think emergent life threatening illness and emergent labs/imaging not indicated today. If persistent symptoms pt may need lab work to rule out above etiologies. Attempted to reassure pt and stressed importance of f/u with PCP for continuation of care/work up. She verbalized understanding.   Final Clinical Impressions(s) / ED Diagnoses   Final diagnoses:  Formication    ED Discharge  Orders    None       Jerrell Mylar 03/15/18 Demetrios Loll, MD 03/15/18 1016

## 2018-03-19 ENCOUNTER — Ambulatory Visit (HOSPITAL_COMMUNITY)
Admission: EM | Admit: 2018-03-19 | Discharge: 2018-03-19 | Disposition: A | Discharge status: Home / Self Care | Payer: Medicaid Other | Attending: Family Medicine | Admitting: Family Medicine

## 2018-03-19 NOTE — ED Notes (Signed)
Patient access reports patient is not wanting to wait to be seen

## 2018-03-25 ENCOUNTER — Ambulatory Visit: Payer: Medicaid Other | Admitting: Obstetrics and Gynecology

## 2018-03-25 ENCOUNTER — Other Ambulatory Visit (HOSPITAL_COMMUNITY)
Admission: RE | Admit: 2018-03-25 | Discharge: 2018-03-25 | Disposition: A | Discharge status: Home / Self Care | Payer: Medicaid Other | Source: Ambulatory Visit | Attending: Obstetrics and Gynecology | Admitting: Obstetrics and Gynecology

## 2018-03-25 ENCOUNTER — Encounter: Payer: Self-pay | Admitting: Obstetrics and Gynecology

## 2018-03-25 VITALS — BP 109/59 | HR 64 | Ht 67.0 in | Wt 145.2 lb

## 2018-03-25 DIAGNOSIS — Z3202 Encounter for pregnancy test, result negative: Secondary | ICD-10-CM

## 2018-03-25 DIAGNOSIS — N912 Amenorrhea, unspecified: Secondary | ICD-10-CM | POA: Insufficient documentation

## 2018-03-25 DIAGNOSIS — Z113 Encounter for screening for infections with a predominantly sexual mode of transmission: Secondary | ICD-10-CM | POA: Diagnosis not present

## 2018-03-25 DIAGNOSIS — N760 Acute vaginitis: Secondary | ICD-10-CM | POA: Diagnosis not present

## 2018-03-25 LAB — POCT URINALYSIS DIPSTICK
BILIRUBIN UA: NEGATIVE
Glucose, UA: NEGATIVE
Leukocytes, UA: NEGATIVE
Nitrite, UA: NEGATIVE
PH UA: 7 (ref 5.0–8.0)
Protein, UA: POSITIVE — AB
Spec Grav, UA: 1.01 (ref 1.010–1.025)
UROBILINOGEN UA: 0.2 U/dL

## 2018-03-25 LAB — POCT URINE PREGNANCY: Preg Test, Ur: NEGATIVE

## 2018-03-25 NOTE — Progress Notes (Signed)
Pt is here with c/o vaginal irritation, itching, burning, and discharge for about a month.  Also desires STD testing LMP 9/18 H/O irregular cycles Sexual active condoms only Same partner off/on x 4 yrs Last IC 1-2 months ago H/O C/Tric as teen Denies any GI dysfunction. + Dysuria  PE AF VSS Lungs clear Heart RRR Abd soft + BS GU nl EGBUS scant clear discharge cultures obtained  A/P STD exposure        Amenorrhea        Vaginal irriation         Dysuria  UPT negative. Pt declines treatment for her irregular cycles STD testing completed today. Urine sent for culture. Will treat as per test results. F/U PRN

## 2018-03-25 NOTE — Patient Instructions (Signed)

## 2018-03-26 LAB — HIV ANTIBODY (ROUTINE TESTING W REFLEX): HIV SCREEN 4TH GENERATION: NONREACTIVE

## 2018-03-26 LAB — HEPATITIS B SURFACE ANTIGEN: HEP B S AG: NEGATIVE

## 2018-03-26 LAB — RPR: RPR: NONREACTIVE

## 2018-03-26 LAB — CERVICOVAGINAL ANCILLARY ONLY
CHLAMYDIA, DNA PROBE: NEGATIVE
NEISSERIA GONORRHEA: NEGATIVE
TRICH (WINDOWPATH): NEGATIVE

## 2018-03-26 LAB — HEPATITIS C ANTIBODY: Hep C Virus Ab: <0.1 s/co ratio (ref 0.0–0.9)

## 2018-03-27 LAB — URINE CULTURE: ORGANISM ID, BACTERIA: NO GROWTH

## 2019-04-21 IMAGING — US US PELVIS COMPLETE TRANSABD/TRANSVAG
1 series · 15 of 25 positions shown · non-contrast
Comparison: None

CLINICAL DATA: 41-year-old female with dysfunctional uterine
bleeding for 1 year. History of tubal ligation and C-section.

LMP 07/05/2017
EXAM:
TRANSABDOMINAL AND TRANSVAGINAL ULTRASOUND OF PELVIS
TECHNIQUE: Both transabdominal and transvaginal ultrasound examinations of the
pelvis were performed. Transabdominal technique was performed for
global imaging of the pelvis including uterus, ovaries, adnexal
regions, and pelvic cul-de-sac. It was necessary to proceed with
endovaginal exam following the transabdominal exam to visualize the
endometrium and adnexa.

[Series 1: us pelvis complete transabd/transvag · 15 of 54 slices shown]
[im 1/54]
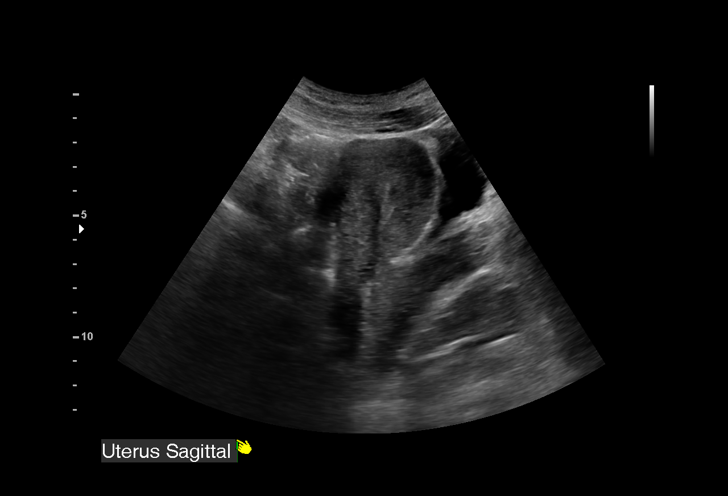
[im 5/54]
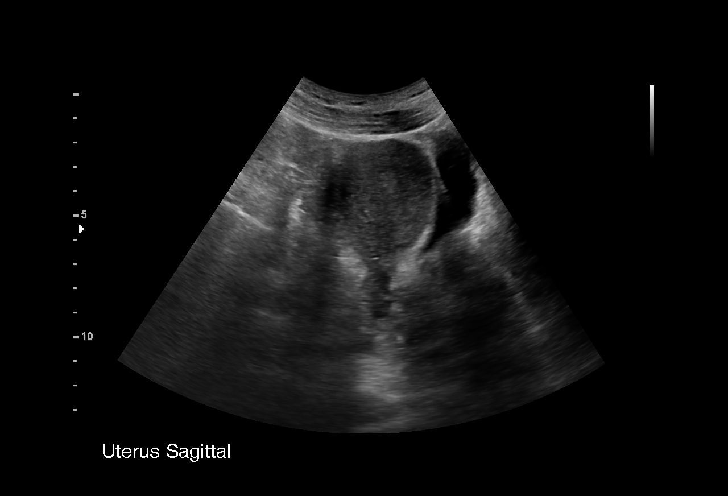
[im 9/54]
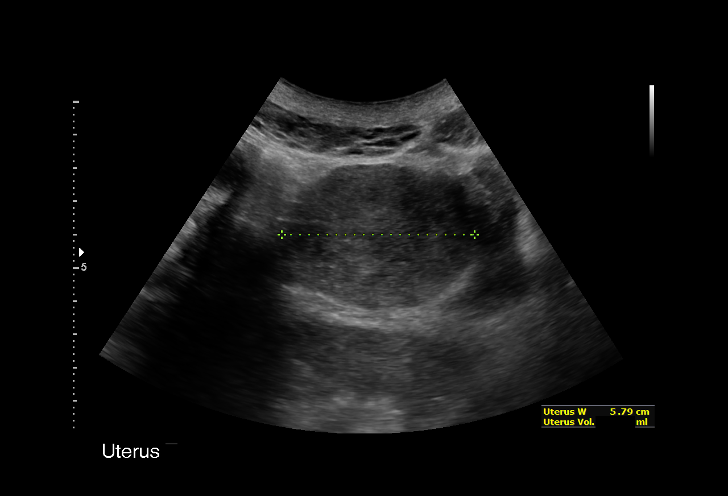
[im 12/54]
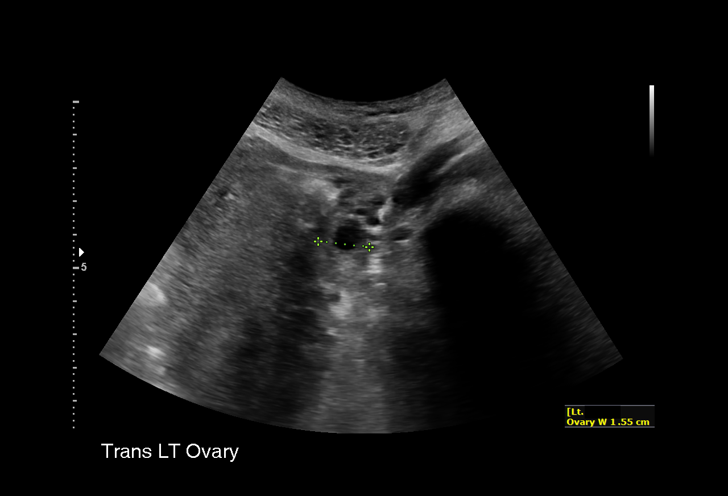
[im 16/54]
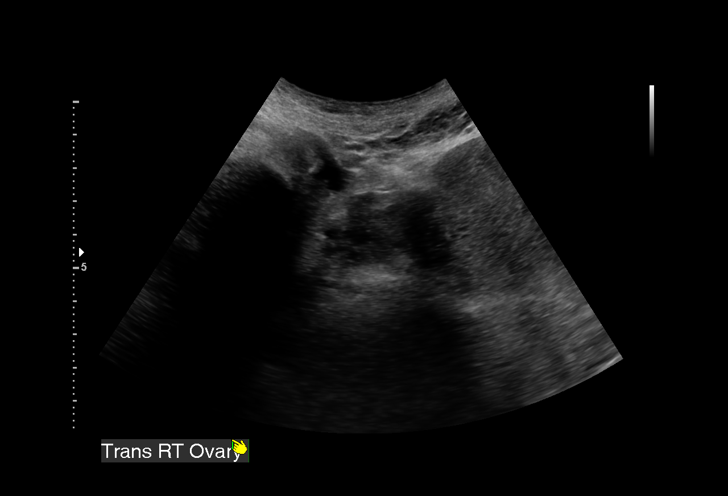
[im 20/54]
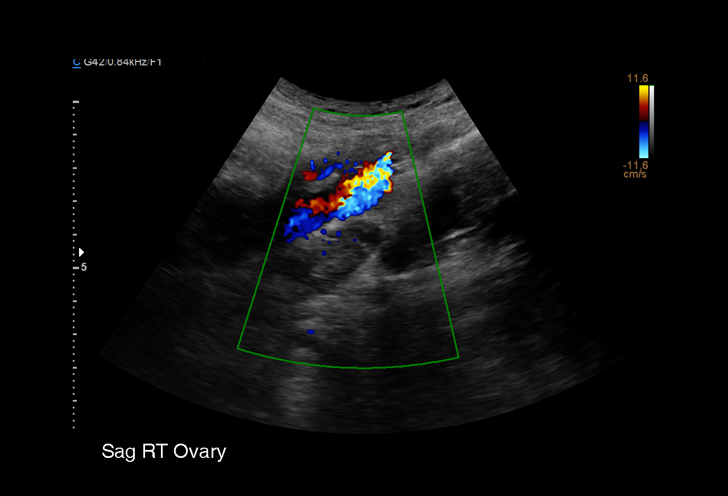
[im 23/54]
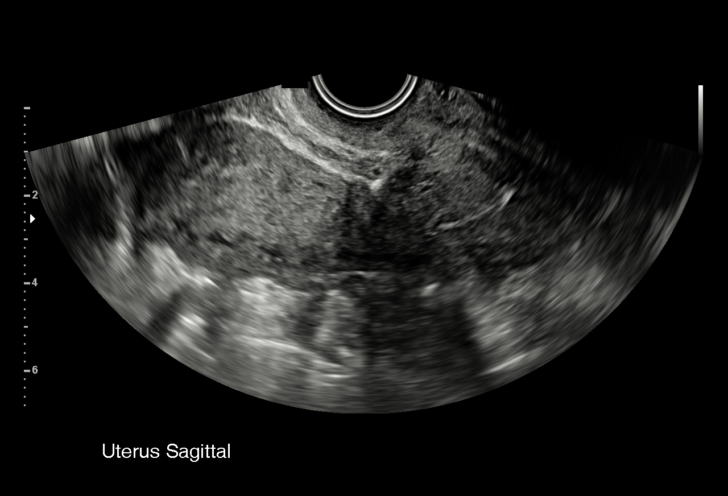
[im 27/54]
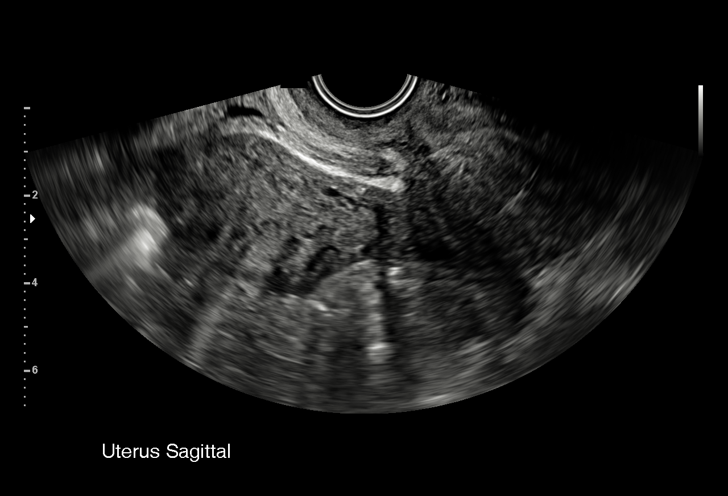
[im 31/54]
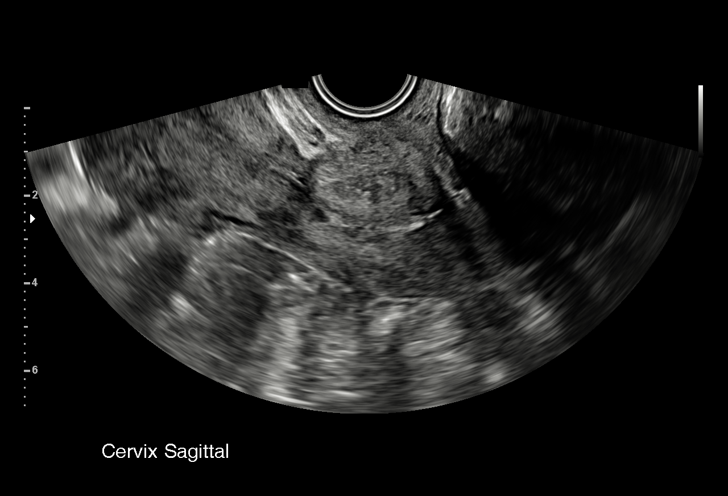
[im 34/54]
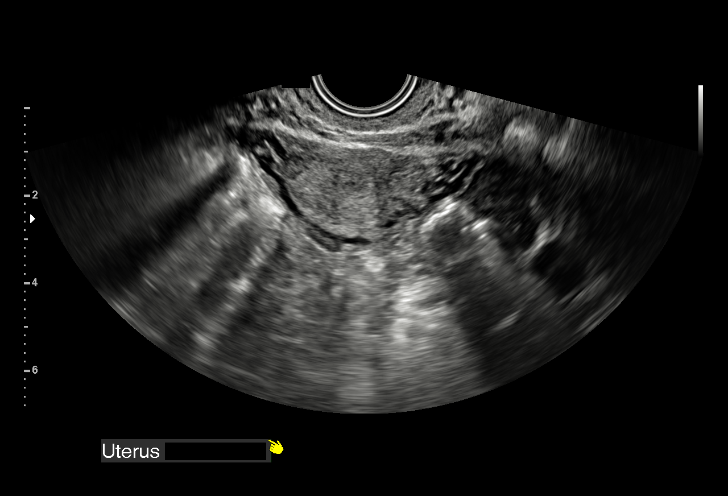
[im 38/54]
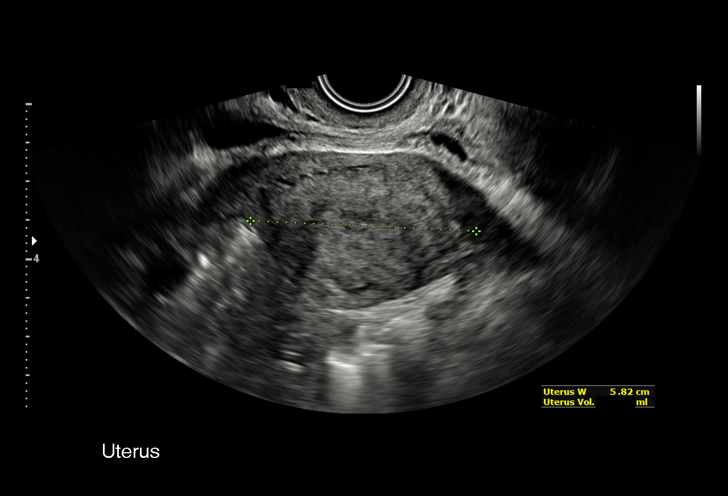
[im 42/54]
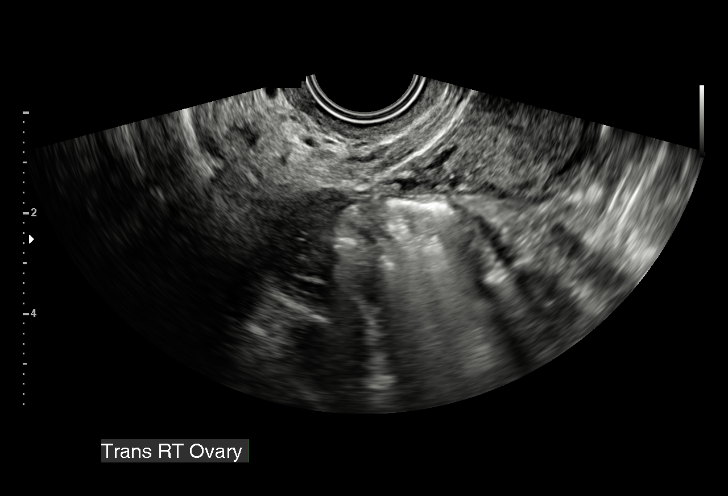
[im 45/54]
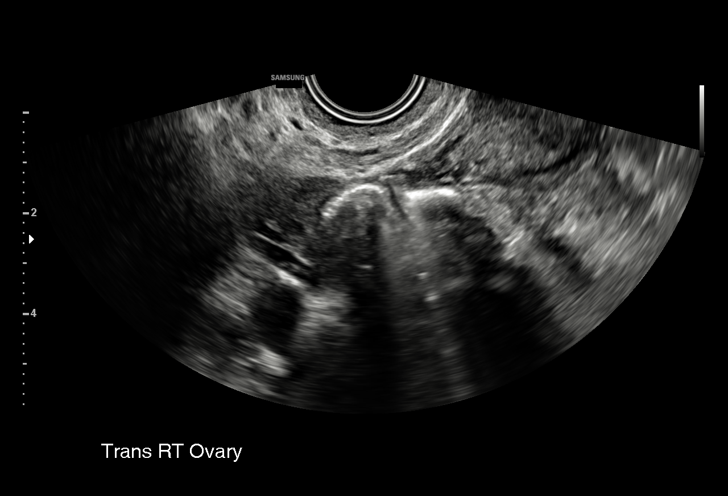
[im 49/54]
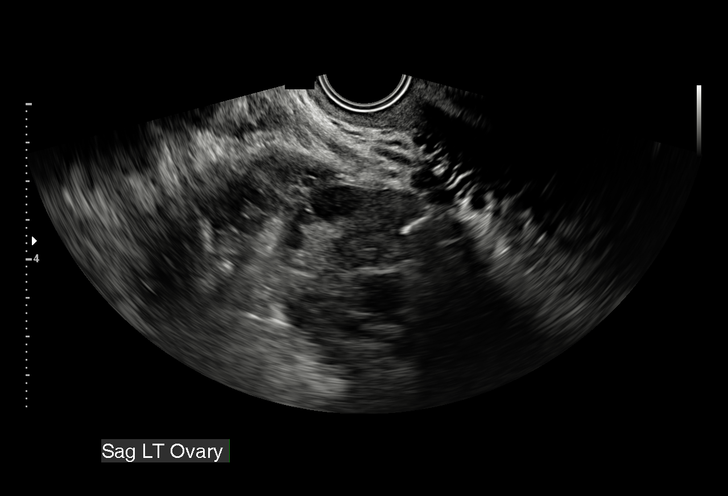
[im 54/54]
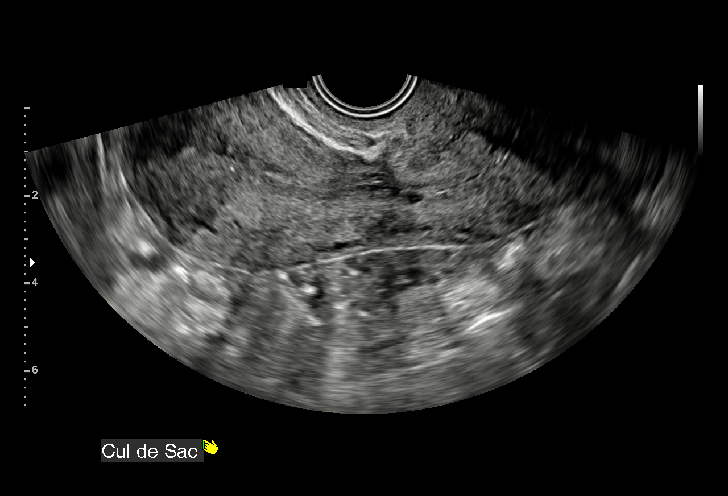

[15 of 25 positions shown; findings below may reference images not displayed]

FINDINGS: Uterus

Measurements: 11.0 x 4.3 x 5.8 cm. Mildly enlarged anteverted
uterus. Cesarean scar is seen in the anterior lower urine segment.
No uterine fibroids or other myometrial abnormalities.

Endometrium

Thickness: 4 mm. No endometrial cavity fluid or focal endometrial
mass.

Right ovary

Measurements: 3.1 x 2.0 x 1.5 cm. Normal appearance/no adnexal mass.

Left ovary

Measurements: 4.0 x 2.3 x 1.6 cm. Normal appearance/no adnexal mass.

Other findings

No abnormal free fluid.
IMPRESSION: Normal pelvic sonogram. No uterine fibroids. No endometrial
abnormality. No abnormal ovarian or adnexal masses.

## 2020-02-13 ENCOUNTER — Inpatient Hospital Stay: Admit: 2020-02-13 | Discharge: 2020-02-13 | Disposition: A | Discharge status: Home / Self Care

## 2020-02-13 DIAGNOSIS — K0889 Other specified disorders of teeth and supporting structures: Secondary | ICD-10-CM

## 2020-02-13 MED ORDER — clindamycin (CLEOCIN) 300 MG capsule
300 | ORAL_CAPSULE | Freq: Three times a day (TID) | ORAL | 0 refills | Status: AC
Start: 2020-02-13 — End: 2020-02-20

## 2020-02-13 MED ORDER — acetaminophen (TYLENOL) 325 MG tablet
325 | ORAL_TABLET | Freq: Four times a day (QID) | ORAL | 0 refills | 11.00000 days | Status: AC | PRN
Start: 2020-02-13 — End: ?

## 2020-02-13 MED ORDER — ibuprofen (MOTRIN) 800 MG tablet
800 | ORAL_TABLET | Freq: Three times a day (TID) | ORAL | 0 refills | Status: AC | PRN
Start: 2020-02-13 — End: ?

## 2020-02-13 NOTE — Unmapped (Signed)
[place] ED Note    Date of Service: 02/13/2020  Reason for Visit: Dental Pain (left lower)      Patient History     HPI    Nursing triage note reviewed      Veronica Rocha is a 44 y.o. female without significant past medical history was presenting to the emergency department today complaining of left lower tooth pain.  Patient states that she has a fractured tooth in the left lower jaw.  She states that she has had pain for quite some time but normally gets food stuck in it and picks it out with a toothpick.  She states that over the last 2 days she has had increasingly worsening pain.  She states that the pain is now a 10 out of 10, throbbing, does not go away.  She has tried ibuprofen but states that she is taking so much ibuprofen she believes that she might overdose because it is not resolving her pain.  Her pain does not radiate.  She has no rashes.  No lymphadenopathy.  No fevers or aches.  No abdominal pain, nausea, or vomiting.    Other than stated above, no additional aggravating or alleviating factors are noted.    History reviewed. No pertinent past medical history.  Past Surgical History:   Procedure Laterality Date   ??? CERVICAL BIOPSY  W/ LOOP ELECTRODE EXCISION  2013   ??? CESAREAN SECTION      x2   ??? VAGINAL WOUND CLOSURE / REPAIR      trauma at age 19     Patient  reports that she has never smoked. She has never used smokeless tobacco. She reports current alcohol use. She reports that she does not use drugs.  Previous Medications    No medications on file       Fhx:   Family History   Problem Relation Age of Onset   ??? Breast Cancer Neg Hx    ??? Clotting disorder Neg Hx    ??? Colon Cancer Neg Hx    ??? Ovarian cancer Neg Hx         Allergies:   Allergies as of 02/13/2020 - Fully Reviewed 02/13/2020   Allergen Reaction Noted   ??? Sulfa (sulfonamide antibiotics) Itching 02/13/2020   ??? Penicillins Rash 06/28/2016       All nursing notes and triage notes were appropriately reviewed in the course of the creation of  this note.     Review of Systems     Review of Systems   Constitutional: Negative for diaphoresis, fever and weight loss.   HENT: Negative for hearing loss and sore throat.    Eyes: Negative for blurred vision and double vision.   Respiratory: Negative for cough, hemoptysis, sputum production, shortness of breath and wheezing.    Cardiovascular: Negative for chest pain, palpitations and leg swelling.   Gastrointestinal: Negative for abdominal pain, blood in stool, constipation, diarrhea, heartburn, melena, nausea and vomiting.   Genitourinary: Negative for dysuria, frequency, hematuria and urgency.   Musculoskeletal: Negative for myalgias.   Skin: Negative for rash.   Neurological: Negative for dizziness, tingling, weakness and headaches.   Endo/Heme/Allergies: Does not bruise/bleed easily.   Psychiatric/Behavioral: Negative for memory loss.       A complete, 10 point review of systems was performed and is otherwise negative    Physical Exam     Vitals:    02/13/20 0621   BP: 128/85   Pulse: 79   Resp:  16   Temp: 98.4 ??F (36.9 ??C)   SpO2: 100%     General:  Well appearing. No acute distress  Head: Atraumatic   Eyes:  Pupils reactive. No discharge from eyes. No conjuntival redness or scleral icterus.   ENT:  MMM. No discharge from nose. OP clear without exudate.  Poor dentition.  Fractured tooth noted left lower jaw.  There is no surrounding periapical abscess.  There is some gingival swelling.  Pulmonary:   Non-labored breathing. Breath sounds clear bilaterally without rales, wheezes, or ronchi.  Cardiac:  Regular rate and rhythm. No murmurs, rubs, or gallops.    Abdomen:  Soft. NBS. Non-distended, non-tender. No HSM.   Musculoskeletal:  No long bone deformity.   Vascular:  Extremities warm and perfused. Normal pulses in all 4 extremities.   Skin:  Dry, no rashes, no jaundice. No spider angiomata/palmar erythema.   Extremities:  No peripheral edema  Neuro:  Alert. Moves all four extremities to command. No focal  deficit  Psych: Appropriate mood and affect, answering questions appropriately   Diagnostic Studies     Labs:  Labs Reviewed - No data to display    Radiology:  No orders to display       EKG:    None    [facility]    Emergency Department Procedures   None    ED Course and MDM       Veronica Rocha is a 44 y.o. female with a history and presentation as described above in HPI.  The patient was evaluated by myself and the ED Attending Physician, Dr. Malachy Mood, and R4 Dr. Margo Aye. All management and disposition plans were discussed and agreed upon.    On evaluation today, it appears that the patient's toothache is most likely due to local dental infection/dental fracture, including dental carie vs pulpitis vs gingivitis. The patient had full range of motion of the tongue with soft sublingual tissues without trismus and thus we have low suspicion of Ludwig's angina. No floor of the mouth adenopathy. There are no signs on exam of RPA or PTA. There are also no signs of a dental abscess that would be amenable to I&D. There are no signs of systemic infection or sepsis. No recent tooth extraction therefore unlikely alveolar osteitis. No evidence of acute necrotizing ulcerative gingivitis.  No abnormal masses that would be concerning for oral malignancy. No leukoplakia or thrush.     I believe that the patient would benefit from a course of antibiotics and was provided with Clindamycin 300 mg TID PO x 7d. They were encouraged to use NSAID's and tylenol for their pain.  We explained that definitive treatment would require follow-up with dentistry and they were provided with a list of local dental clinics.      As the patient is well-appearing and able to tolerate PO intake, we feel they are appropriate for discharge to home in good condition with strict return precautions and customary discharge teaching.     This was discussed with the patient and all questions were addressed and answered. The patient agrees with the plan,  understands reasons to return to the ED and will follow-up as indicated.               Medications received during this ED visit:  Medications - No data to display      Impression     1. Pain, dental         Plan     I do  not believe that this patient requires any further work-up or inpatient management for their complaints, and are appropriate for outpatient follow-up.  I discussed the findings of my physical exam and work-up with the patient, and they were given the opportunity to ask questions.  The patient is agreeable with the plan and is ready to be discharged.    The patient was discharged with prescriptions for clindamycin.   Patient instructed to follow-up with PCP and dental in the next 3 days , and given strict return precautions. All of patient's questions were answered satisfactorily, ambulating without assistance with stable vital signs and she was subsequently sent home in stable condition.          Derrek Gu, MD, PGY-1  [facility]      Please note that this note was dictated using voice-recognition software, which occasionally leads to inadvertent typographic errors       Critical Care Time (Attendings)        Derrek Gu, MD  Resident  02/13/20 [id number]       Derrek Gu, MD  Resident  02/13/20 [phone]

## 2020-02-13 NOTE — ED Triage Notes (Signed)
Pt comes to ER tonight with complaints of dental pain.

## 2020-02-13 NOTE — Unmapped (Signed)
You were seen today for dental pain. Please take the antibiotics and pain medication we prescribed you. It is very important you follow up with a dentist. Return to the ER for trouble swallowing, fever, rapid swelling or any other concerns.    [place] Dental and/or Oral Surgery Resources:    [place]  [facility]  [address]  [place], [place] [address]  [phone]  *Dental*  Directions:  From [address] take the [address] exit  Turn left on [address]  Turn right at [address]  Turn left at [address]  Slight left to stay on [address]  Continue on [address]    [place] Dental and/or Oral Surgery Resources:    [place]  [facility]  [place] Office  [address] [place] [address]  [phone]  *Dental*  Directions:  Take [address] S entering [place]  Take exit 191 for [address] toward [address]  Turn right at [address]    [place]  [facility] [place] [facility]  [address]  [place] [address]  [phone] or [phone]  *Dental and Oral Surgery*  Directions:  Take I-[address] S  Take exit onto I-64 E/1-75 S  Take exit 115 toward [place]  Merge onto [address]  Turn left at [address]  Continue on [address]  Turn right at [address]  Continue on [address]    [place]  [facility]  [address] [place] [address]  [phone]  *Dental and Oral Surgery*  Directions:  Take the ramp to [address] S entering [place]  Take the exit onto I-[address] S toward [place]  Take the exit onto [address]-65 S toward [place]  Take exit [address]C for [address] toward downtown  Keep left at the fork, follow signs for [address]  and merge onto [address]  Turn right at [address]  Continue on [address]  Turn right at [address]    [place] Dental and/or Oral Surgery Care Resources:    [place]  [facility] [place]  [facility]  [address]  [place], [place] [address]  [phone]  *Dental*  Directions:  Take I-74 W entering [place]  Take the I-465 S/I-74 W exit  Merge onto I-465 W/[place] -37 S  Take exit 53A to merge onto I-65 N toward [place]  Take exit 113 for [address] toward [address]  Turn left at [address]    [place]  [facility]  [address] [place] [address]  [phone]  *Dental and Oral Surgery*  Directions:  Take I-74 W entering [place]  Take the I-465 S/I-74 W exit  Merge onto I-465 W/[place] -37 S  Take exit 53A to merge onto I-65 N toward [place]  Take exit 110B on the left to merge onto I-70 W toward [place]  Take exit [address]  Turn right at [address]  Turn left at [address]    [place]  [facility]  [address] [place] [address]  [phone]  *Dental*  Directions:  Take 1-74W  Take exit 5 to merge onto I-275 S toward [place]  entering [place]  Take   exit 16 for [address] toward  [place]  Turn left at [address]  Turn right at [address]    Dental Emergency Referrals:    Clinic Name/Address/ Phone Number Hours of  Operation Emergency  Hours Residency/Age  Requirements Payment  Methods Followup  Tx   [place] HEALTH DEPT. CLINICS:    [facility]  [address] (25)  [phone]    [facility]  [address] (10)  [phone]    [facility]  [address] (29)  [phone]    [facility]  [address] (04)  [phone]    [facility]  [address] (23)  [phone]   M-F 8-5 M-F 7:30  Patients should arrive  at 7:30 a.m. (all  emergencies are  seen on a firstcome,  first-served  basis). Pts. not seen  by 9:30 will be worked  in between pts. who  have appointments [place]  residents only  Picture ID & proof of  address required.  All ages accepted  including at the [facility]    Attention: Please  choose the clinic  closest to your home Sliding Fee Scale  $20 Per service  minimum  Medicaid  Insurance  Self-Pay  Note: All clinics  (city and all  others listed)  offering sliding  fee scales require  proof of income Available   [facility] [phone]  [address] (14) M 12-5  W 9-1  TH 3-7  F 9-5 Appointment only No residency  requirements  All ages accepted Sliding Fee Scale  Per service min.  Medicaid/Insurance  Self-Pay Available   [facility] [phone]  [address] (32) M,T,Th 8:30-5  W 8:30-7  F 8:30-3:30 By appointment only No residency  requirements  All ages accepted Sliding Fee Scale  Per visit min.  Medicaid  Insurance  Self-Pay Available   [facility] [phone]  (Referral Agency) Toll Free: [phone]  [address] (03) Application process/  Must Qualify Varies by dentist  availability Varies by dentist  availability [place] and [place] in [place]  All ages accepted Discounted or  Donated Dental  Access Program Varies by  dentist  availability   [facility] [phone]  [address]  2nd Floor  Must Qualify M-TH 7:30-4:30  F 7:30-3  [place]  Certified Homeless  Only All ages accepted  Must qualify Most services are  at no cost; others  will have minimal  fees Available   [facility] [phone]  [address] (10) M-F 9-5:30 M,T,Th,F 9:30-11:30  & 2-4:30  W 9:30-11:30 No residency  requirements  All ages accepted Medicaid  Insurance  Self-Pay Available   [facility] [phone]  [place]: [address]  [place]: [address] M-F 8:30-5 By appointment only [place] residents  only Sliding Fee Scale  Medicaid  Self-Pay Available   [facility] [phone]  [address] (29)  *In case of a dental emergency call [phone] and ask for  the dental resident on call. M,TH 8-6  T,W 8-5  F 8-3 Go to E.R. and ask for  dentist on call No   residency  requirements  Accepts patients up to  age 14 Medicaid  Insurance  Self-Pay  Financial  Counseling Some  Limitations   [facility]  Dental Clinic [address] [place] [address]  [phone] M/T/Th/F 8:30-5:30  W 8:30-7:30 By Appointment only No residency  requirements, all ages  accepted Medicaid  Sliding scale Fees  Insurance  Self Pay    [facility] [phone]  [address] (55)  *In case of a dental emergency call [phone] and ask for  the dental resident on call. M,T 8-6  W 8-5  TH 8-12 Go to E.R. and ask for  dentist on call No residency  requirements Medicaid  Insurance  Self-Pay  Financial  Counseling Not Available   [facility] [place] [phone]  [address]  [place], [place] [address]  *In case of a dental emergency call [phone] and ask for  the dental resident on call. M-TH 8-5:30 By appointment only No residency  requirements Medicaid  Insurance  Self-Pay  Financial  Counseling Available   [facility] [phone]  [address]  [place], [place] [address]  *In case of a dental emergency call [phone] and ask for  the dental resident on call. M,W 8-6  T 9-5  TH 7-3 Current patients only,  others go to E.R. and  ask for dentist on call No residency  requirements Medicaid  Insurance  Self-Pay  Financial  Counseling Some  Limitations   [facility] [phone]  [facility]  [address]  [place], [place] [address] M,TH 9-7  T,W,F 8:30-5 By appointment only No residency  requirements  All ages accepted Sliding Fee Scale  Medicaid  Insurance  Self-Pay Available   [facility] [phone]  [facility] [phone] By appointment  M-Th 8-4 By appointment only Some residency  requirements; [place] residents for tax levy  assistance Medicaid  Insurance  Self-Pay  Referral required  for Oral Surgery Available   [facility]  [phone] Call for an appt.  (October thru May)  T/TH 8-12:30 W 1-6 By appointment only No residency  requirements Minimal fee for  services Cleanings  x-rays   [facility]  [facility] [phone]  [address] (15)  Across from [employer], [address] Exit off [address] Call for an appt.  M-F 8-5 By appointment only  Emergencies on first  come first served  basis No residency  Requirements  Spanish-speaking staff Sliding Fee Scale  Medicaid  Insurance  Self-pay Available   [facility]  [place]: [place] on [address] [place] [address] [phone]  [place]: [address], [phone] Call for appt.  M-F Children to age 21 No residency  requirements Medicaid  Medicaid Managed  Care Available   [facility] [phone]  [facility]  [address]  [place], [place] [address] M-F 8:00-4:30 Children to age 13 No residency  requirements Self Pay  Insurance (no PPO  or HMO)  Medicaid Available   [facility] [phone]  [address]  [place], [place] [address]  (in the [facility]) M-F 8:00-5:00 By appointment only All ages  No residency  requirements  Spanish speaking Medicaid  Sliding Fee scale  Insurance  Self-pay Available

## 2020-02-13 NOTE — ED Provider Notes (Signed)
ED Attending Attestation Note    Date of service:  02/13/2020    This patient was seen by the resident physician.  I have seen and examined the patient, agree with the workup, evaluation, management and diagnosis. The care plan has been discussed and I concur.     My assessment reveals a 44 y.o. female recently returned to [place] from [place], without insurance.  She has simple dental pain.  No evidence of tracheal infection.  History of penicillin allergy.  Being treated with clindamycin.  Given referrals for dental and primary care.
